# Patient Record
Sex: Male | Born: 1935 | Race: White | Hispanic: No | Marital: Married | Smoking: Never smoker
Health system: Southern US, Community
[De-identification: ages and names within clinical notes are randomized; demographics above are authoritative.]

## PROBLEM LIST (undated history)

## (undated) DIAGNOSIS — I080 Rheumatic disorders of both mitral and aortic valves: Secondary | ICD-10-CM

## (undated) DIAGNOSIS — F0280 Dementia in other diseases classified elsewhere without behavioral disturbance: Secondary | ICD-10-CM

## (undated) DIAGNOSIS — G309 Alzheimer's disease, unspecified: Principal | ICD-10-CM

## (undated) DIAGNOSIS — I444 Left anterior fascicular block: Secondary | ICD-10-CM

## (undated) DIAGNOSIS — I2510 Atherosclerotic heart disease of native coronary artery without angina pectoris: Secondary | ICD-10-CM

## (undated) DIAGNOSIS — I4821 Permanent atrial fibrillation: Secondary | ICD-10-CM

## (undated) DIAGNOSIS — E785 Hyperlipidemia, unspecified: Secondary | ICD-10-CM

## (undated) HISTORY — DX: Dementia in other diseases classified elsewhere, unspecified severity, without behavioral disturbance, psychotic disturbance, mood disturbance, and anxiety: F02.80

## (undated) HISTORY — PX: CHOLECYSTECTOMY: SHX55

## (undated) HISTORY — DX: Atherosclerotic heart disease of native coronary artery without angina pectoris: I25.10

## (undated) HISTORY — DX: Alzheimer's disease, unspecified: G30.9

## (undated) HISTORY — DX: Permanent atrial fibrillation: I48.21

## (undated) HISTORY — DX: Hyperlipidemia, unspecified: E78.5

## (undated) HISTORY — PX: HERNIA REPAIR: SHX51

## (undated) HISTORY — DX: Left anterior fascicular block: I44.4

## (undated) HISTORY — DX: Rheumatic disorders of both mitral and aortic valves: I08.0

---

## 2003-02-02 ENCOUNTER — Inpatient Hospital Stay (HOSPITAL_COMMUNITY): Admission: AD | Admit: 2003-02-02 | Discharge: 2003-02-04 | Payer: Self-pay | Admitting: Pulmonary Disease

## 2003-09-07 ENCOUNTER — Inpatient Hospital Stay (HOSPITAL_COMMUNITY): Admission: EM | Admit: 2003-09-07 | Discharge: 2003-09-10 | Payer: Self-pay | Admitting: Cardiology

## 2004-11-17 ENCOUNTER — Ambulatory Visit: Payer: Self-pay | Admitting: Cardiology

## 2004-11-26 ENCOUNTER — Ambulatory Visit: Payer: Self-pay | Admitting: Cardiology

## 2004-12-10 ENCOUNTER — Ambulatory Visit: Payer: Self-pay | Admitting: Cardiology

## 2004-12-30 ENCOUNTER — Ambulatory Visit: Payer: Self-pay | Admitting: Cardiology

## 2005-01-14 ENCOUNTER — Ambulatory Visit: Payer: Self-pay | Admitting: Cardiology

## 2005-01-22 ENCOUNTER — Ambulatory Visit: Payer: Self-pay

## 2005-02-05 ENCOUNTER — Ambulatory Visit: Payer: Self-pay | Admitting: Cardiology

## 2005-03-05 ENCOUNTER — Ambulatory Visit: Payer: Self-pay | Admitting: Cardiology

## 2005-04-02 ENCOUNTER — Ambulatory Visit: Payer: Self-pay | Admitting: Cardiology

## 2005-04-30 ENCOUNTER — Ambulatory Visit: Payer: Self-pay | Admitting: Cardiology

## 2005-06-01 ENCOUNTER — Ambulatory Visit: Payer: Self-pay | Admitting: Internal Medicine

## 2005-06-08 ENCOUNTER — Ambulatory Visit: Payer: Self-pay | Admitting: Cardiology

## 2005-06-22 ENCOUNTER — Ambulatory Visit: Payer: Self-pay | Admitting: Cardiology

## 2005-06-29 ENCOUNTER — Ambulatory Visit: Payer: Self-pay | Admitting: Cardiology

## 2005-07-13 ENCOUNTER — Ambulatory Visit: Payer: Self-pay | Admitting: Cardiology

## 2005-08-17 ENCOUNTER — Ambulatory Visit: Payer: Self-pay | Admitting: Cardiology

## 2005-08-25 ENCOUNTER — Ambulatory Visit: Payer: Self-pay | Admitting: Cardiology

## 2005-09-03 ENCOUNTER — Ambulatory Visit: Payer: Self-pay | Admitting: Cardiology

## 2005-10-01 ENCOUNTER — Ambulatory Visit: Payer: Self-pay | Admitting: Cardiology

## 2005-10-29 ENCOUNTER — Ambulatory Visit: Payer: Self-pay | Admitting: Cardiology

## 2005-11-27 ENCOUNTER — Ambulatory Visit: Payer: Self-pay | Admitting: Cardiology

## 2005-12-03 ENCOUNTER — Ambulatory Visit: Payer: Self-pay | Admitting: Cardiology

## 2005-12-16 ENCOUNTER — Ambulatory Visit: Payer: Self-pay | Admitting: Cardiology

## 2005-12-18 ENCOUNTER — Ambulatory Visit: Payer: Self-pay | Admitting: Cardiology

## 2006-01-05 ENCOUNTER — Ambulatory Visit: Payer: Self-pay | Admitting: Internal Medicine

## 2006-01-14 ENCOUNTER — Ambulatory Visit: Payer: Self-pay | Admitting: Cardiology

## 2006-02-18 ENCOUNTER — Ambulatory Visit: Payer: Self-pay | Admitting: Cardiology

## 2006-03-18 ENCOUNTER — Ambulatory Visit: Payer: Self-pay | Admitting: Cardiology

## 2006-04-15 ENCOUNTER — Ambulatory Visit: Payer: Self-pay | Admitting: Cardiology

## 2006-05-18 ENCOUNTER — Ambulatory Visit: Payer: Self-pay | Admitting: Cardiology

## 2006-05-31 ENCOUNTER — Ambulatory Visit: Payer: Self-pay | Admitting: Cardiology

## 2006-06-30 ENCOUNTER — Ambulatory Visit: Payer: Self-pay | Admitting: Cardiology

## 2006-07-15 ENCOUNTER — Ambulatory Visit: Payer: Self-pay | Admitting: Cardiology

## 2007-11-30 ENCOUNTER — Encounter: Payer: Self-pay | Admitting: Cardiology

## 2007-11-30 ENCOUNTER — Ambulatory Visit: Payer: Self-pay | Admitting: Cardiology

## 2007-12-06 ENCOUNTER — Ambulatory Visit: Payer: Self-pay | Admitting: Cardiovascular Disease

## 2007-12-06 ENCOUNTER — Inpatient Hospital Stay (HOSPITAL_BASED_OUTPATIENT_CLINIC_OR_DEPARTMENT_OTHER): Admission: RE | Admit: 2007-12-06 | Discharge: 2007-12-06 | Payer: Self-pay | Admitting: Cardiovascular Disease

## 2007-12-06 ENCOUNTER — Inpatient Hospital Stay (HOSPITAL_COMMUNITY): Admission: AD | Admit: 2007-12-06 | Discharge: 2007-12-09 | Payer: Self-pay | Admitting: Cardiovascular Disease

## 2007-12-12 ENCOUNTER — Ambulatory Visit: Payer: Self-pay | Admitting: Cardiology

## 2007-12-13 ENCOUNTER — Ambulatory Visit: Payer: Self-pay | Admitting: Cardiology

## 2007-12-20 ENCOUNTER — Ambulatory Visit: Payer: Self-pay | Admitting: Cardiology

## 2007-12-27 ENCOUNTER — Ambulatory Visit: Payer: Self-pay | Admitting: Cardiology

## 2007-12-27 ENCOUNTER — Encounter: Payer: Self-pay | Admitting: Physician Assistant

## 2008-01-12 ENCOUNTER — Ambulatory Visit: Payer: Self-pay | Admitting: Cardiology

## 2008-01-20 ENCOUNTER — Ambulatory Visit: Payer: Self-pay | Admitting: Cardiology

## 2008-04-17 ENCOUNTER — Ambulatory Visit: Payer: Self-pay | Admitting: Cardiology

## 2008-07-11 ENCOUNTER — Ambulatory Visit: Payer: Self-pay | Admitting: Cardiology

## 2008-07-11 ENCOUNTER — Encounter: Payer: Self-pay | Admitting: Cardiology

## 2008-07-25 ENCOUNTER — Ambulatory Visit: Payer: Self-pay | Admitting: Cardiology

## 2008-07-25 ENCOUNTER — Encounter: Payer: Self-pay | Admitting: Physician Assistant

## 2008-07-31 ENCOUNTER — Ambulatory Visit: Payer: Self-pay | Admitting: Cardiology

## 2008-12-05 ENCOUNTER — Ambulatory Visit: Payer: Self-pay | Admitting: Cardiovascular Disease

## 2008-12-05 ENCOUNTER — Ambulatory Visit: Payer: Self-pay | Admitting: Cardiology

## 2008-12-05 DIAGNOSIS — I4891 Unspecified atrial fibrillation: Secondary | ICD-10-CM

## 2008-12-05 DIAGNOSIS — I2510 Atherosclerotic heart disease of native coronary artery without angina pectoris: Secondary | ICD-10-CM

## 2008-12-05 DIAGNOSIS — I080 Rheumatic disorders of both mitral and aortic valves: Secondary | ICD-10-CM | POA: Insufficient documentation

## 2009-02-06 ENCOUNTER — Ambulatory Visit: Payer: Self-pay | Admitting: Cardiology

## 2009-03-25 ENCOUNTER — Ambulatory Visit: Payer: Self-pay | Admitting: Cardiology

## 2009-03-26 ENCOUNTER — Encounter: Payer: Self-pay | Admitting: Cardiology

## 2009-08-05 ENCOUNTER — Ambulatory Visit: Payer: Self-pay | Admitting: Cardiology

## 2009-11-22 ENCOUNTER — Encounter (INDEPENDENT_AMBULATORY_CARE_PROVIDER_SITE_OTHER): Payer: Self-pay | Admitting: *Deleted

## 2009-12-04 ENCOUNTER — Ambulatory Visit: Payer: Self-pay | Admitting: Cardiology

## 2009-12-31 ENCOUNTER — Encounter: Payer: Self-pay | Admitting: Cardiology

## 2010-02-17 ENCOUNTER — Ambulatory Visit: Payer: Self-pay | Admitting: Cardiology

## 2010-02-17 DIAGNOSIS — I4460 Unspecified fascicular block: Secondary | ICD-10-CM

## 2010-02-24 ENCOUNTER — Encounter: Payer: Self-pay | Admitting: Cardiology

## 2010-02-27 ENCOUNTER — Encounter: Payer: Self-pay | Admitting: Cardiology

## 2010-02-27 ENCOUNTER — Ambulatory Visit: Payer: Self-pay | Admitting: Cardiology

## 2010-02-28 ENCOUNTER — Encounter: Payer: Self-pay | Admitting: Cardiology

## 2010-03-07 ENCOUNTER — Ambulatory Visit: Payer: Self-pay | Admitting: Cardiology

## 2010-03-14 ENCOUNTER — Ambulatory Visit: Payer: Self-pay | Admitting: Cardiology

## 2010-03-14 LAB — CONVERTED CEMR LAB: POC INR: 2.3

## 2010-03-21 ENCOUNTER — Ambulatory Visit: Payer: Self-pay | Admitting: Cardiology

## 2010-03-28 ENCOUNTER — Ambulatory Visit: Payer: Self-pay | Admitting: Cardiology

## 2010-04-04 ENCOUNTER — Ambulatory Visit: Payer: Self-pay | Admitting: Cardiology

## 2010-04-04 LAB — CONVERTED CEMR LAB: POC INR: 4

## 2010-04-07 ENCOUNTER — Encounter: Payer: Self-pay | Admitting: Cardiology

## 2010-04-08 ENCOUNTER — Ambulatory Visit: Payer: Self-pay | Admitting: Cardiology

## 2010-04-18 ENCOUNTER — Ambulatory Visit: Payer: Self-pay | Admitting: Cardiology

## 2010-04-18 LAB — CONVERTED CEMR LAB: POC INR: 2.9

## 2010-04-22 ENCOUNTER — Telehealth (INDEPENDENT_AMBULATORY_CARE_PROVIDER_SITE_OTHER): Payer: Self-pay | Admitting: *Deleted

## 2010-04-25 ENCOUNTER — Ambulatory Visit: Payer: Self-pay | Admitting: Cardiology

## 2010-04-25 LAB — CONVERTED CEMR LAB: POC INR: 3

## 2010-05-23 ENCOUNTER — Ambulatory Visit: Payer: Self-pay | Admitting: Cardiology

## 2010-06-13 ENCOUNTER — Ambulatory Visit: Payer: Self-pay | Admitting: Cardiology

## 2010-06-13 LAB — CONVERTED CEMR LAB: POC INR: 2.4

## 2010-07-11 ENCOUNTER — Ambulatory Visit: Payer: Self-pay | Admitting: Cardiology

## 2010-08-08 ENCOUNTER — Ambulatory Visit: Payer: Self-pay | Admitting: Cardiology

## 2010-08-12 ENCOUNTER — Ambulatory Visit: Payer: Self-pay | Admitting: Cardiology

## 2010-09-05 ENCOUNTER — Ambulatory Visit: Payer: Self-pay | Admitting: Cardiology

## 2010-10-03 ENCOUNTER — Ambulatory Visit: Payer: Self-pay | Admitting: Cardiology

## 2010-10-03 LAB — CONVERTED CEMR LAB: POC INR: 1.2

## 2010-10-13 ENCOUNTER — Ambulatory Visit: Payer: Self-pay | Admitting: Cardiology

## 2010-10-17 ENCOUNTER — Ambulatory Visit: Payer: Self-pay | Admitting: Cardiology

## 2010-10-17 LAB — CONVERTED CEMR LAB: POC INR: 2.7

## 2010-11-21 ENCOUNTER — Ambulatory Visit: Payer: Self-pay | Admitting: Cardiology

## 2010-12-01 ENCOUNTER — Ambulatory Visit: Payer: Self-pay | Admitting: Cardiology

## 2010-12-10 ENCOUNTER — Telehealth (INDEPENDENT_AMBULATORY_CARE_PROVIDER_SITE_OTHER): Payer: Self-pay | Admitting: *Deleted

## 2010-12-19 ENCOUNTER — Ambulatory Visit: Payer: Self-pay | Admitting: Cardiology

## 2010-12-19 LAB — CONVERTED CEMR LAB: POC INR: 2.6

## 2011-01-20 NOTE — Medication Information (Signed)
Summary: ccr-lr  Anticoagulant Therapy  Managed by: Vashti Hey, RN PCP: Dr. Ernestine Conrad Supervising MD: Andee Lineman MD, Michelle Piper Indication 1: Atrial Fibrillation Lab Used: [facility] [address] Site: [place] INR POC 2.4  Dietary changes: no    Health status changes: no    Bleeding/hemorrhagic complications: no    Recent/future hospitalizations: no    Any changes in medication regimen? no    Recent/future dental: no  Any missed doses?: no       Is patient compliant with meds? yes       Allergies: No Known Drug Allergies  Anticoagulation Management History:      The patient is taking warfarin and comes in today for a routine follow up visit.  Positive risk factors for bleeding include an age of 75 years or older.  The bleeding index is 'intermediate risk'.  Positive CHADS2 values include Age > 75 years old.  His last INR was 3.1.  Anticoagulation responsible provider: Andee Lineman MD, Michelle Piper.  INR POC: 2.4.  Cuvette Lot#: [id number].  Exp: 07/2011.    Anticoagulation Management Assessment/Plan:      The patient's current anticoagulation dose is Coumadin 5 mg tabs: take as directed per PMD.  The target INR is 2.0-3.0.  The next INR is due 09/05/2010.  Anticoagulation instructions were given to patient.  Results were reviewed/authorized by Vashti Hey, RN.  He was notified by Vashti Hey RN.         Prior Anticoagulation Instructions: INR 3.2 Hold coumadin tonight then resume 2.5mg  once daily except 5mg  on Wednesdays  Current Anticoagulation Instructions: INR 2.4 Continue coumadin 2.5mg  once daily except 5mg  on Wednesdays

## 2011-01-20 NOTE — Medication Information (Signed)
Summary: ccr-lr  Anticoagulant Therapy  Managed by: Vashti Hey, RN PCP: Dr. Ernestine Conrad Supervising MD: Andee Lineman MD, Michelle Piper Indication 1: Atrial Fibrillation Lab Used: [facility] [place] Site: [place] INR POC 1.2  Dietary changes: no    Health status changes: no    Bleeding/hemorrhagic complications: no    Recent/future hospitalizations: no    Any changes in medication regimen? no    Recent/future dental: no  Any missed doses?: no       Is patient compliant with meds? yes       Allergies: No Known Drug Allergies  Anticoagulation Management History:      The patient is taking warfarin and comes in today for a routine follow up visit.  Positive risk factors for bleeding include an age of 75 years or older.  The bleeding index is 'intermediate risk'.  Positive CHADS2 values include Age > 40 years old.  His last INR was 3.1.  Anticoagulation responsible provider: Andee Lineman MD, Michelle Piper.  INR POC: 1.2.  Cuvette Lot#: [id number].  Exp: 07/2011.    Anticoagulation Management Assessment/Plan:      The patient's current anticoagulation dose is Coumadin 5 mg tabs: take as directed per PMD.  The target INR is 2.0-3.0.  The next INR is due 10/17/2010.  Anticoagulation instructions were given to patient.  Results were reviewed/authorized by Vashti Hey, RN.  He was notified by Vashti Hey RN.         Prior Anticoagulation Instructions: INR 3.2 Hold coumadin tonight then resueme 2.5mg  once daily except 5mg  on Wednesdays  Current Anticoagulation Instructions: INR 1.2 Take coumadin 5mg  tonight and tomorrow night then resume 2.5mg  once daily except 5mg  on Wednesdays

## 2011-01-20 NOTE — Medication Information (Signed)
Summary: ccr-lr  Anticoagulant Therapy  Managed by: Vashti Hey, RN PCP: Dr. Ernestine Conrad Supervising MD: Diona Browner MD, Remi Deter Indication 1: Atrial Fibrillation Lab Used: [facility] [place] Site: [place] INR POC 2.9  Dietary changes: no    Health status changes: no    Bleeding/hemorrhagic complications: no    Recent/future hospitalizations: yes       Details: pending TEE cardioversion  Any changes in medication regimen? no    Recent/future dental: no  Any missed doses?: no       Is patient compliant with meds? yes       Allergies: No Known Drug Allergies  Anticoagulation Management History:      The patient is taking warfarin and comes in today for a routine follow up visit.  Positive risk factors for bleeding include an age of 75 years or older.  The bleeding index is 'intermediate risk'.  Negative CHADS2 values include Age > 13 years old.  His last INR was 3.1.  Anticoagulation responsible provider: Diona Browner MD, Remi Deter.  INR POC: 2.9.  Cuvette Lot#: [id number].    Anticoagulation Management Assessment/Plan:      The patient's current anticoagulation dose is Coumadin 5 mg tabs: take as directed per PMD.  The target INR is 2.0-3.0.  The next INR is due 04/25/2010.  Anticoagulation instructions were given to patient.  Results were reviewed/authorized by Vashti Hey, RN.  He was notified by Vashti Hey RN.         Prior Anticoagulation Instructions: INR 4.0 Hold coumadin tonight then take 1/2 tablet once daily except 1 tablet on Wednesdays  Current Anticoagulation Instructions: INR 2.9 Continue coumadin 2.5mg  once daily except 5mg  on Wednesdays

## 2011-01-20 NOTE — Progress Notes (Signed)
Summary: decline DCCV   Phone Note Other Incoming   Summary of Call: Spoke with patient at last coumadin check regarding pending DCCV.  Pt. states he really is not sure about doing this procedure.  States that if Dr. Andee Lineman feels like he definitely needs this done, he would.  But, if he is okay to continue along as is with comadin and routine follow up with MD he would prefer that.   Hoover Brunette, LPN  Apr 22, 1609 11:07 AM    Follow-up for Phone Call        Discussed with Dr. Earnestine Leys who states pt is okay to postpone DCCV, if he chooses. Pt can schedule OV to d/w Dr. Earnestine Leys if desired. Per Dr. Earnestine Leys, pt is okay to no longer have weekly CCR visits since he wants to hold off on DCCV at present. Follow-up by: Cyril Loosen, RN, BSN,  Apr 25, 2010 9:11 AM     Appended Document: decline DCCV  Pt has reminder in IDX for Sept f/u with Dr. Earnestine Leys. Pt offered appt sooner to discuss DCCV. Pt declines and states he will see Dr. Earnestine Leys in Sept as planned.

## 2011-01-20 NOTE — Miscellaneous (Signed)
Summary: Orders Update - dccv  Clinical Lists Changes  Orders: Added new Referral order of Cardioversion (Cardioversion) - Signed 

## 2011-01-20 NOTE — Medication Information (Signed)
Summary: ccr-lr  Anticoagulant Therapy  Managed by: Vashti Hey, RN PCP: Dr. Ernestine Conrad Supervising MD: Diona Browner MD, Remi Deter Indication 1: Atrial Fibrillation Lab Used: [facility] [place] Site: [place] INR POC 3.3  Dietary changes: no    Health status changes: no    Bleeding/hemorrhagic complications: no    Recent/future hospitalizations: no    Any changes in medication regimen? no    Recent/future dental: no  Any missed doses?: no       Is patient compliant with meds? yes       Allergies: No Known Drug Allergies  Anticoagulation Management History:      The patient is taking warfarin and comes in today for a routine follow up visit.  Positive risk factors for bleeding include an age of 75 years or older.  The bleeding index is 'intermediate risk'.  Negative CHADS2 values include Age > 75 years old.  His last INR was 3.1.  Anticoagulation responsible provider: Diona Browner MD, Remi Deter.  INR POC: 3.3.  Cuvette Lot#: [id number].    Anticoagulation Management Assessment/Plan:      The patient's current anticoagulation dose is Coumadin 5 mg tabs: take as directed per PMD.  The target INR is 2.0-3.0.  The next INR is due 03/28/2010.  Anticoagulation instructions were given to patient.  Results were reviewed/authorized by Vashti Hey, RN.  He was notified by Vashti Hey RN.         Prior Anticoagulation Instructions: INR 2.3 Increase coumadin 2.5mg  once daily except 5mg  on M,W,F Pending DCCV  Current Anticoagulation Instructions: INR 3.3 Take coumadin 2.5mg  once daily except 5mg  on Mondays and Wednesdays

## 2011-01-20 NOTE — Medication Information (Signed)
Summary: ccr  Anticoagulant Therapy  Managed by: Vashti Hey, RN PCP: Dr. Ernestine Conrad Supervising MD: Andee Lineman MD, Michelle Piper Indication 1: Atrial Fibrillation Lab Used: [facility] [place] Site: [place] INR POC 2.3  Dietary changes: no    Health status changes: no    Bleeding/hemorrhagic complications: no    Recent/future hospitalizations: yes       Details: pending DCCV  Any changes in medication regimen? no    Recent/future dental: no  Any missed doses?: no       Is patient compliant with meds? yes       Allergies: No Known Drug Allergies  Anticoagulation Management History:      The patient is taking warfarin and comes in today for a routine follow up visit.  Positive risk factors for bleeding include an age of 32 years or older.  The bleeding index is 'intermediate risk'.  Negative CHADS2 values include Age > 54 years old.  His last INR was 3.1.  Anticoagulation responsible provider: Andee Lineman MD, Michelle Piper.  INR POC: 2.3.  Cuvette Lot#: [id number].    Anticoagulation Management Assessment/Plan:      The patient's current anticoagulation dose is Coumadin 5 mg tabs: take as directed per PMD.  The target INR is 2.0-3.0.  The next INR is due 03/21/2010.  Anticoagulation instructions were given to patient.  Results were reviewed/authorized by Vashti Hey, RN.  He was notified by Vashti Hey RN.         Prior Anticoagulation Instructions: INR 2.8 Continue coumadin 2.5mg  once daily except 5mg  on Mondays and Fridays  Current Anticoagulation Instructions: INR 2.3 Increase coumadin 2.5mg  once daily except 5mg  on M,W,F Pending DCCV

## 2011-01-20 NOTE — Medication Information (Signed)
Summary: ccr-lr  Anticoagulant Therapy  Managed by: Vashti Hey, RN PCP: Dr. Ernestine Conrad Supervising MD: Andee Lineman MD, Michelle Piper Indication 1: Atrial Fibrillation Lab Used: [facility] [place] Site: [place] INR POC 4.0  Dietary changes: no    Health status changes: no    Bleeding/hemorrhagic complications: no    Recent/future hospitalizations: yes       Details: Pending DCCV  Any changes in medication regimen? no    Recent/future dental: no  Any missed doses?: no       Is patient compliant with meds? yes       Allergies: No Known Drug Allergies  Anticoagulation Management History:      The patient is taking warfarin and comes in today for a routine follow up visit.  Positive risk factors for bleeding include an age of 75 years or older.  The bleeding index is 'intermediate risk'.  Negative CHADS2 values include Age > 64 years old.  His last INR was 3.1.  Anticoagulation responsible provider: Andee Lineman MD, Michelle Piper.  INR POC: 4.0.  Cuvette Lot#: [id number].    Anticoagulation Management Assessment/Plan:      The patient's current anticoagulation dose is Coumadin 5 mg tabs: take as directed per PMD.  The target INR is 2.0-3.0.  The next INR is due 04/18/2010.  Anticoagulation instructions were given to patient.  Results were reviewed/authorized by Vashti Hey, RN.  He was notified by Vashti Hey RN.         Prior Anticoagulation Instructions: INR 3.7 Hold coumadin tonight then resume coumadin 2.5mg  once daily except 5mg  on Mondays and Wednesdays  Current Anticoagulation Instructions: INR 4.0 Hold coumadin tonight then take 1/2 tablet once daily except 1 tablet on Wednesdays

## 2011-01-20 NOTE — Medication Information (Signed)
Summary: ccr-lr  Anticoagulant Therapy  Managed by: Vashti Hey, RN PCP: Dr. Ernestine Conrad Supervising MD: Myrtis Ser MD, Tinnie Gens Indication 1: Atrial Fibrillation Lab Used: [facility] [place] Site: [place] INR POC 2.7  Dietary changes: no    Health status changes: no    Bleeding/hemorrhagic complications: no    Recent/future hospitalizations: no    Any changes in medication regimen? no    Recent/future dental: no  Any missed doses?: no       Is patient compliant with meds? yes       Allergies: No Known Drug Allergies  Anticoagulation Management History:      The patient is taking warfarin and comes in today for a routine follow up visit.  Positive risk factors for bleeding include an age of 21 years or older.  The bleeding index is 'intermediate risk'.  Positive CHADS2 values include Age > 13 years old.  His last INR was 3.1.  Anticoagulation responsible provider: Myrtis Ser MD, Tinnie Gens.  INR POC: 2.7.  Cuvette Lot#: [id number].  Exp: 07/2011.    Anticoagulation Management Assessment/Plan:      The patient's current anticoagulation dose is Coumadin 5 mg tabs: take as directed per PMD.  The target INR is 2.0-3.0.  The next INR is due 11/11/2010.  Anticoagulation instructions were given to patient.  Results were reviewed/authorized by Vashti Hey, RN.  He was notified by Vashti Hey RN.         Prior Anticoagulation Instructions: INR 1.2 Take coumadin 5mg  tonight and tomorrow night then resume 2.5mg  once daily except 5mg  on Wednesdays  Current Anticoagulation Instructions: INR 2.7 Continue coumadin 2.5mg  once daily except 5mg  on Wednesdays

## 2011-01-20 NOTE — Procedures (Signed)
Summary: Holter and Event/ [facility]  Holter and Event/ [facility]   Imported By: Dorise Hiss 02/27/2010 09:23:48  _____________________________________________________________________  External Attachment:    Type:   Image     Comment:   External Document  Appended Document: Holter and Event/ [facility] patient is in permanent afib. schedule for DCCV next week . has been long term on coumadin. make sure recent INR's ok.   Appended Document: Holter and Event/ [facility] Patient notified.   As discussed previously, pt. has not been checked since January.  Former PMD was [facility], but swithched to [employer] due to insurance change.  Checked with both offices and looks like he has not had checked since January.  Will wait till therapeutic x 4 weeks, then proceed with scheduling DCCV.

## 2011-01-20 NOTE — Medication Information (Signed)
Summary: rov/sp  Anticoagulant Therapy  Managed by: Vashti Hey, RN PCP: Dr. Ernestine Conrad Supervising MD: Diona Browner MD, Remi Deter Indication 1: Atrial Fibrillation Lab Used: [facility] [place] Site: [place] INR POC 3.2  Dietary changes: no    Health status changes: no    Bleeding/hemorrhagic complications: no    Recent/future hospitalizations: no    Any changes in medication regimen? no    Recent/future dental: no  Any missed doses?: no       Is patient compliant with meds? yes       Allergies: No Known Drug Allergies  Anticoagulation Management History:      The patient is taking warfarin and comes in today for a routine follow up visit.  Positive risk factors for bleeding include an age of 75 years or older.  The bleeding index is 'intermediate risk'.  Positive CHADS2 values include Age > 75 years old.  His last INR was 3.1.  Anticoagulation responsible provider: Diona Browner MD, Remi Deter.  INR POC: 3.2.  Cuvette Lot#: [id number].  Exp: 07/2011.    Anticoagulation Management Assessment/Plan:      The patient's current anticoagulation dose is Coumadin 5 mg tabs: take as directed per PMD.  The target INR is 2.0-3.0.  The next INR is due 08/08/2010.  Anticoagulation instructions were given to patient.  Results were reviewed/authorized by Vashti Hey, RN.  He was notified by Vashti Hey RN.         Prior Anticoagulation Instructions: INR 2.4  Continue same dose of 1/2 tablet every day except 1 tablet on Wednesday.    Current Anticoagulation Instructions: INR 3.2 Hold coumadin tonight then resume 2.5mg  once daily except 5mg  on Wednesdays

## 2011-01-20 NOTE — Letter (Signed)
Summary: Appointment -missed  [facility] [address]   [place], [place] [address]   Phone: [phone]  Fax: [phone]     March 28, 2010 MRN: [id number]     Ian [name] [address] [place], [place]  [address]     Dear Mr. Ian [name],  Our records indicate you missed your appointment on March 28, 2010                        with [facility].   It is very important that we reach you to reschedule this appointment. We look forward to participating in your health care needs.   Please contact us at the number listed above at your earliest convenience to reschedule this appointment.   Sincerely,    Glass blower/designer

## 2011-01-20 NOTE — Medication Information (Signed)
Summary: ccr-lr  Anticoagulant Therapy  Managed by: Vashti Hey, RN PCP: Dr. Ernestine Conrad Supervising MD: Diona Browner MD, Remi Deter Indication 1: Atrial Fibrillation Lab Used: [facility] [place] Site: [place] INR POC 3.2  Dietary changes: no    Health status changes: no    Bleeding/hemorrhagic complications: no    Recent/future hospitalizations: no    Any changes in medication regimen? no    Recent/future dental: no  Any missed doses?: no       Is patient compliant with meds? yes       Allergies: No Known Drug Allergies  Anticoagulation Management History:      The patient is taking warfarin and comes in today for a routine follow up visit.  Positive risk factors for bleeding include an age of 21 years or older.  The bleeding index is 'intermediate risk'.  Positive CHADS2 values include Age > 75 years old.  His last INR was 3.1.  Anticoagulation responsible provider: Diona Browner MD, Remi Deter.  INR POC: 3.2.  Cuvette Lot#: [id number].  Exp: 07/2011.    Anticoagulation Management Assessment/Plan:      The patient's current anticoagulation dose is Coumadin 5 mg tabs: take as directed per PMD.  The target INR is 2.0-3.0.  The next INR is due 12/19/2010.  Anticoagulation instructions were given to patient.  Results were reviewed/authorized by Vashti Hey, RN.  He was notified by Vashti Hey RN.         Prior Anticoagulation Instructions: INR 2.7 Continue coumadin 2.5mg  once daily except 5mg  on Wednesdays  Current Anticoagulation Instructions: INR 3.2 Hold coumadin toniight then resume 2.5mg  once daily except 5mg  on Wednesdays

## 2011-01-20 NOTE — Assessment & Plan Note (Signed)
Summary: 6 month fu-rec v reminder vs   Visit Type:  Follow-up Primary Provider:  Dr. Ernestine Conrad   History of Present Illness: the patient's is a 77 year old Ian [name] with a history of coronary artery disease, treated medically, chronic left anterior fascicular block and chronic atrial fibrillation. Initially we had plans to cardiovert the patient earlier this year but ultimately the patient decided against it. His rate has remained controlled and is now in chronic A. fib . He denies any chest pain. He has no orthopnea PND palpitations or syncope. He actually works full-time and experiences no decrease in his exercise tolerance. His last cardiac catheterization was done in 2008.  Preventive Screening-Counseling & Management  Alcohol-Tobacco     Smoking Status: never  Current Medications (verified): 1)  Coumadin 5 Mg Tabs (Warfarin Sodium) .... Take As Directed Per Pmd 2)  Nitrostat 0.4 Mg Subl (Nitroglycerin) .... Place 1 Tablet Under Tongue As Directed 3)  Aspirin 81 Mg Tbec (Aspirin) .... Take 1 Tablet By Mouth Once A Day 4)  Pindolol 5 Mg Tabs (Pindolol) .... Take 1 Tablet By Mouth Once A Day 5)  Vitamin E 100 Unit Caps (Vitamin E) .... Take 1 Tablet By Mouth Once A Day 6)  3 Study Drugs (A, B, & C)  [place] Study .... Take 1 Tablet By Mouth Once A Day 7)  Multivitamins  Tabs (Multiple Vitamin) .... Take 1 Tablet By Mouth Once A Day  Allergies (verified): No Known Drug Allergies  Comments:  Nurse/Medical Assistant: The patient's medication list and allergies were reviewed with the patient and were updated in the Medication and Allergy Lists.  Past History:  Past Surgical History: Last updated: 12/05/2008 Cholecystectomy Bilateral hernia repair  Family History: Last updated: 12/05/2008 Father:? heart disease  Social History: Last updated: 12/05/2008 Married  Tobacco Use - No.   Risk Factors: Smoking Status: never (10/13/2010)  Past Medical History: Current Problems:    MITRAL REGURG W/ AORTIC INSUFF, RHEUM/NON-RHEUM (ICD-396.3) CAD, NATIVE VESSEL (ICD-414.01)Diffuse high grade LAD disease-medical Rx recommended 12/08 ATRIAL FIBRILLATION (ICD-427.31) Chronic atrial fibrillation. The patient ultimately declined cardioversion in April of 2011 Chronic Left Anterior Fasicular block Coumadin Rx IMPROVE-IT study Atrial Fibrillation CAD Hyperlipidemia  Review of Systems  The patient denies fatigue, malaise, fever, weight gain/loss, vision loss, decreased hearing, hoarseness, chest pain, palpitations, shortness of breath, prolonged cough, wheezing, sleep apnea, coughing up blood, abdominal pain, blood in stool, nausea, vomiting, diarrhea, heartburn, incontinence, blood in urine, muscle weakness, joint pain, leg swelling, rash, skin lesions, headache, fainting, dizziness, depression, anxiety, enlarged lymph nodes, easy bruising or bleeding, and environmental allergies.    Vital Signs:  Patient profile:   77 year old Ian [name] Height:      72 inches Weight:      194 pounds Pulse rate:   64 / minute BP sitting:   119 / 84  (left arm) Cuff size:   regular  Vitals Entered By: Carlye Grippe (October 13, 2010 9:45 AM)  Physical Exam  Additional Exam:  General: Well-developed, well-nourished in no distress head: Normocephalic and atraumatic eyes PERRLA/EOMI intact, conjunctiva and lids normal nose: No deformity or lesions mouth normal dentition, normal posterior pharynx neck: Supple, no JVD.  No masses, thyromegaly or abnormal cervical nodes lungs: Normal breath sounds bilaterally without wheezing.  Normal percussion heart: irregular rate and rhythm with normal S1 and S2, no S3 or S4.  PMI is normal.  No pathological murmurs abdomen: Normal bowel sounds, abdomen is soft and nontender without masses, organomegaly  or hernias noted.  No hepatosplenomegaly musculoskeletal: Back normal, normal gait muscle strength and tone normal pulsus: Pulse is normal in all 4  extremities Extremities: No peripheral pitting edema neurologic: Alert and oriented x 3 skin: Intact without lesions or rashes cervical nodes: No significant adenopathy psychologic: Normal affect    Impression & Recommendations:  Problem # 1:  COUMADIN THERAPY (ICD-V58.61) patient is compliant with Coumadin therapy and is followed in our Coumadin clinic.  Problem # 2:  HEMIBLOCK, LEFT ANTERIOR (ICD-426.2) Assessment: Comment Only  The following medications were removed from the medication list:    Acebutolol Hcl 200 Mg Caps (Acebutolol hcl) .Marland Kitchen... Take 1 tablet by mouth once a day His updated medication list for this problem includes:    Coumadin 5 Mg Tabs (Warfarin sodium) .Marland Kitchen... Take as directed per pmd    Nitrostat 0.4 Mg Subl (Nitroglycerin) .Marland Kitchen... Place 1 tablet under tongue as directed    Aspirin 81 Mg Tbec (Aspirin) .Marland Kitchen... Take 1 tablet by mouth once a day    Pindolol 5 Mg Tabs (Pindolol) .Marland Kitchen... Take 1 tablet by mouth once a day  Problem # 3:  ATRIAL FIBRILLATION, PAROXYSMAL (ICD-427.31) the patient is now in chronic atrial fibrillation. There are no further plans for cardioversion he is asymptomatic and his heart rate is controlled. The following medications were removed from the medication list:    Acebutolol Hcl 200 Mg Caps (Acebutolol hcl) .Marland Kitchen... Take 1 tablet by mouth once a day His updated medication list for this problem includes:    Coumadin 5 Mg Tabs (Warfarin sodium) .Marland Kitchen... Take as directed per pmd    Aspirin 81 Mg Tbec (Aspirin) .Marland Kitchen... Take 1 tablet by mouth once a day    Pindolol 5 Mg Tabs (Pindolol) .Marland Kitchen... Take 1 tablet by mouth once a day  Patient Instructions: 1)  Your physician wants you to follow-up in: 1 year. You will receive a reminder letter in the mail one-two months in advance. If you don't receive a letter, please call our office to schedule the follow-up appointment. 2)  Your physician recommends that you continue on your current medications as directed.  Please refer to the Current Medication list given to you today.

## 2011-01-20 NOTE — Assessment & Plan Note (Signed)
Summary: ccrv  --agh  Nurse Visit  Comments INR 3.1.  See coumadin document.     Allergies: No Known Drug Allergies Laboratory Results   Blood Tests      INR: 3.1   (Normal Range: 0.88-1.12   Therap INR: 2.0-3.5) Comments: Pt. will continue 2.5mg  qd except 5mg  on M,F Recheck 1 week, on 3/18. Pending DCCV

## 2011-01-20 NOTE — Medication Information (Signed)
Summary: ccr-lr  Anticoagulant Therapy  Managed by: Weston Brass, PharmD PCP: Dr. Ernestine Conrad Supervising MD: Andee Lineman MD, Michelle Piper Indication 1: Atrial Fibrillation Lab Used: [facility] [place] Site: [place] INR POC 2.4  Dietary changes: no    Health status changes: no    Bleeding/hemorrhagic complications: no    Recent/future hospitalizations: no    Any changes in medication regimen? no    Recent/future dental: no  Any missed doses?: no       Is patient compliant with meds? yes       Allergies: No Known Drug Allergies  Anticoagulation Management History:      The patient is taking warfarin and comes in today for a routine follow up visit.  Positive risk factors for bleeding include an age of 72 years or older.  The bleeding index is 'intermediate risk'.  Negative CHADS2 values include Age > 49 years old.  His last INR was 3.1.  Anticoagulation responsible provider: Andee Lineman MD, Michelle Piper.  INR POC: 2.4.  Cuvette Lot#: [id number].  Exp: 07/2011.    Anticoagulation Management Assessment/Plan:      The patient's current anticoagulation dose is Coumadin 5 mg tabs: take as directed per PMD.  The target INR is 2.0-3.0.  The next INR is due 07/11/2010.  Anticoagulation instructions were given to patient.  Results were reviewed/authorized by Weston Brass, PharmD.  He was notified by Weston Brass PharmD.         Prior Anticoagulation Instructions: INR 3.0 Continue coumadin 2.5mg  once daily except 5mg  on Wednesdays  Current Anticoagulation Instructions: INR 2.4  Continue same dose of 1/2 tablet every day except 1 tablet on Wednesday.

## 2011-01-20 NOTE — Medication Information (Signed)
Summary: ccr-lr  Anticoagulant Therapy  Managed by: Vashti Hey, RN PCP: Dr. Ernestine Conrad Supervising MD: Diona Browner MD, Remi Deter Indication 1: Atrial Fibrillation Lab Used: [facility] [place] Site: [place] INR POC 3.7  Dietary changes: no    Health status changes: no    Bleeding/hemorrhagic complications: no    Recent/future hospitalizations: no    Any changes in medication regimen? no    Recent/future dental: no  Any missed doses?: no       Is patient compliant with meds? yes       Allergies: No Known Drug Allergies  Anticoagulation Management History:      The patient is taking warfarin and comes in today for a routine follow up visit.  Positive risk factors for bleeding include an age of 33 years or older.  The bleeding index is 'intermediate risk'.  Negative CHADS2 values include Age > 31 years old.  His last INR was 3.1.  Anticoagulation responsible provider: Diona Browner MD, Remi Deter.  INR POC: 3.7.  Cuvette Lot#: [id number].    Anticoagulation Management Assessment/Plan:      The patient's current anticoagulation dose is Coumadin 5 mg tabs: take as directed per PMD.  The target INR is 2.0-3.0.  The next INR is due 04/04/2010.  Anticoagulation instructions were given to patient.  Results were reviewed/authorized by Vashti Hey, RN.  He was notified by Vashti Hey RN.         Prior Anticoagulation Instructions: INR 3.3 Take coumadin 2.5mg  once daily except 5mg  on Mondays and Wednesdays  Current Anticoagulation Instructions: INR 3.7 Hold coumadin tonight then resume coumadin 2.5mg  once daily except 5mg  on Mondays and Wednesdays

## 2011-01-20 NOTE — Medication Information (Signed)
Summary: CCR-LAST CHECK 04/25/10-JM  Anticoagulant Therapy  Managed by: Vashti Hey, RN PCP: Dr. Ernestine Conrad Supervising MD: Diona Browner MD, Remi Deter Indication 1: Atrial Fibrillation Lab Used: [facility] [place] Site: [place] INR POC 3.0  Dietary changes: no    Health status changes: no    Bleeding/hemorrhagic complications: no    Recent/future hospitalizations: no    Any changes in medication regimen? no    Recent/future dental: no  Any missed doses?: no       Is patient compliant with meds? yes       Allergies: No Known Drug Allergies  Anticoagulation Management History:      The patient is taking warfarin and comes in today for a routine follow up visit.  Positive risk factors for bleeding include an age of 75 years or older.  The bleeding index is 'intermediate risk'.  Negative CHADS2 values include Age > 61 years old.  His last INR was 3.1.  Anticoagulation responsible provider: Diona Browner MD, Remi Deter.  INR POC: 3.0.  Cuvette Lot#: [id number].    Anticoagulation Management Assessment/Plan:      The patient's current anticoagulation dose is Coumadin 5 mg tabs: take as directed per PMD.  The target INR is 2.0-3.0.  The next INR is due 06/13/2010.  Anticoagulation instructions were given to patient.  Results were reviewed/authorized by Vashti Hey, RN.  He was notified by Vashti Hey RN.         Prior Anticoagulation Instructions: INR 3.0 Continue coumadin 2.5mg  once daily except 5mg  on Wednesdays  Current Anticoagulation Instructions: Same as Prior Instructions.

## 2011-01-20 NOTE — Medication Information (Signed)
Summary: ccr-lr  Anticoagulant Therapy  Managed by: Cyril Loosen, RN PCP: Dr. Ernestine Conrad Supervising MD: Andee Lineman MD, Michelle Piper Indication 1: Atrial Fibrillation Lab Used: [facility] [place] Site: [place] INR POC 3.0  Dietary changes: no    Health status changes: no    Bleeding/hemorrhagic complications: no    Recent/future hospitalizations: yes       Details: Pt has declined DCCV  OK with Dr Andee Lineman to hold off for now  Any changes in medication regimen? no    Recent/future dental: no  Any missed doses?: no       Is patient compliant with meds? yes       Allergies: No Known Drug Allergies  Anticoagulation Management History:      The patient is taking warfarin and comes in today for a routine follow up visit.  Positive risk factors for bleeding include an age of 75 years or older.  The bleeding index is 'intermediate risk'.  Negative CHADS2 values include Age > 52 years old.  His last INR was 3.1.  Anticoagulation responsible provider: Andee Lineman MD, Michelle Piper.  INR POC: 3.0.  Cuvette Lot#: [id number].    Anticoagulation Management Assessment/Plan:      The patient's current anticoagulation dose is Coumadin 5 mg tabs: take as directed per PMD.  The target INR is 2.0-3.0.  The next INR is due 05/23/2010.  Anticoagulation instructions were given to patient.  Results were reviewed/authorized by Cyril Loosen, RN.  He was notified by Cyril Loosen, RN.        Coagulation management information includes: Pt has declined DCCV  OK with Dr Andee Lineman to hold off for now.  Does not need weekly checks any longer.  Prior Anticoagulation Instructions: INR 2.9 Continue coumadin 2.5mg  once daily except 5mg  on Wednesdays  Current Anticoagulation Instructions: INR 3.0 Continue coumadin 2.5mg  once daily except 5mg  on Wednesdays

## 2011-01-20 NOTE — Miscellaneous (Signed)
Summary: Orders Update - BMET,PT/INR  Clinical Lists Changes  Orders: Added new Test order of T-Basic Metabolic Panel ([id number]) - Signed Added new Test order of T-Protime, Auto ([id number]) - Signed 

## 2011-01-20 NOTE — Medication Information (Signed)
Summary: Coumadin Clinic  Anticoagulant Therapy  Managed by: Hoover Brunette, LPN PCP: Dr. Ernestine Conrad Supervising MD: Diona Browner MD, Remi Deter Indication 1: Atrial Fibrillation Lab Used: [facility] [place] Site: [place] INR POC 3.1  Dietary changes: no    Health status changes: no    Bleeding/hemorrhagic complications: no    Recent/future hospitalizations: yes       Details: pending DCCV  Any changes in medication regimen? no    Recent/future dental: no  Any missed doses?: no       Is patient compliant with meds? yes       Allergies: No Known Drug Allergies  Anticoagulation Management History:      The patient is taking warfarin and comes in today for a routine follow up visit.  Positive risk factors for bleeding include an age of 75 years or older.  The bleeding index is 'intermediate risk'.  Negative CHADS2 values include Age > 75 years old.  His last INR was 3.1.  Anticoagulation responsible provider: Diona Browner MD, Remi Deter.  INR POC: 3.1.  Cuvette Lot#: [id number].    Anticoagulation Management Assessment/Plan:      The patient's current anticoagulation dose is Coumadin 5 mg tabs: take as directed per PMD.  The target INR is 2.0-3.0.  The next INR is due 03/07/2010.  Anticoagulation instructions were given to patient.  Results were reviewed/authorized by Hoover Brunette, LPN.  He was notified by Hoover Brunette, LPN.        Coagulation management information includes: pending DCCV   Coumadin previously managed by PMD.  Current Anticoagulation Instructions: INR 3.1 Continue coumadin 2.5mg  once daily except 5mg  on Mondays and Fridays

## 2011-01-20 NOTE — Assessment & Plan Note (Signed)
Summary: 1 YR FU PER FEB REMINDER-SRS   Primary Provider:  Dr. Ernestine Conrad   History of Present Illness: the patient a 77 year old male with a history of single-vessel coronary artery diseaseand paroxysmal atrial fibrillation. Next  The patient reported approximately one month ago and irregular heartbeat. He felt that his pulse was skipping. He also noticed some tachycardia palpitations. They would typically last for several minutes. There was some associated weakness. The patient went to see his primary care physician no further action was taken. The patient denies any chest pain shortness of breath orthopnea PND. On exam today the patient appears to be in atrial fibrillation.  Clinical Review Panels:  Cardiac Imaging Cardiac Cath Findings     IMPRESSION:  The patient clearly has had anginal sounding chest pain   both with his stress test and at work.  I cannot explain why his Myoview   would have been read as inferolateral ischemia.  The OM branch and the   right do not have significant disease.  Despite the fact that the   patient may get angina from his diagonal and apical LAD   disease which is unchanged from 2004, I think there has been significant   progression in the mid to distal LAD.  This is his largest vessel and I   think should probably be stented.      The films will be reviewed with Dr. Riley Kill.  If so, we will proceed   with heparin and Plavix.               Noralyn Pick. 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 Take 1 Tablet By Mouth Once A Day 2)  Coumadin 5 Mg Tabs (Warfarin Sodium) .... Take As Directed Per Pmd 3)  Nitrostat 0.4 Mg Subl (Nitroglycerin) .... Place 1 Tablet Under Tongue As Directed 4)  Aspirin 81 Mg Tbec (Aspirin) .... Take 1 Tablet By Mouth Once A Day 5)  Pindolol 5 Mg Tabs (Pindolol) .... Take 1 Tablet By Mouth Once A Day 6)  Vitamin E 100 Unit Caps (Vitamin E) .... Take 1 Tablet By Mouth Once A Day 7)  3 Study Drugs (A, B, & C)  [place] Study .... Take 1 Tablet By Mouth Once A Day  Allergies (verified): No Known Drug Allergies  Past History:  Past Medical History: Last updated: 12/05/2008 Current Problems:  MITRAL REGURG W/ AORTIC INSUFF, RHEUM/NON-RHEUM (ICD-396.3) CAD, NATIVE VESSEL (ICD-414.01)Diffuse high grade LAD disease-medical Rx recommended 12/08 ATRIAL FIBRILLATION (ICD-427.31) Chronic Left Anterior Fasicular block Coumadin Rx IMPROVE-IT study Atrial Fibrillation CAD Hyperlipidemia  Past Surgical History: Last updated: 12/05/2008 Cholecystectomy Bilateral hernia repair  Family History: Last updated: 12/05/2008 Father:? heart disease  Social History: Last updated: 12/05/2008 Married  Tobacco Use - No.   Risk Factors: Smoking Status: never (02/17/2010)  Review of Systems       The patient complains of palpitations.  The patient denies fatigue, malaise, fever, weight gain/loss, vision loss, decreased hearing, hoarseness, chest pain, shortness of breath, prolonged cough, wheezing,  sleep apnea, coughing up blood, abdominal pain, blood in stool, nausea, vomiting, diarrhea, heartburn, incontinence, blood in urine, muscle weakness, joint pain, leg swelling, rash, skin lesions, headache, fainting, dizziness, depression, anxiety, enlarged lymph nodes, easy bruising or bleeding, and environmental allergies.    Vital Signs:  Patient profile:   77 year old male Height:      72 inches Weight:      192.50  pounds BMI:     26.20 Pulse rate:   76 / minute BP sitting:   109 / 73  (left arm) Cuff size:   regular  Vitals Entered By: Hoover Brunette, LPN (February 17, 2010 10:45 AM)  Nutrition Counseling: Patient's BMI is greater than 25 and therefore counseled on weight management options. Is Patient Diabetic? No Comments YEARLY CHECK   Physical Exam  Additional Exam:  General: Well-developed, well-nourished in no distress head: Normocephalic and atraumatic eyes PERRLA/EOMI intact, conjunctiva and lids normal nose: No deformity or lesions mouth normal dentition, normal posterior pharynx neck: Supple, no JVD.  No masses, thyromegaly or abnormal cervical nodes lungs: Normal breath sounds bilaterally without wheezing.  Normal percussion heart: irregular rate and rhythm with normal S1 and S2, no S3 or S4.  PMI is normal.  No pathological murmurs abdomen: Normal bowel sounds, abdomen is soft and nontender without masses, organomegaly or hernias noted.  No hepatosplenomegaly musculoskeletal: Back normal, normal gait muscle strength and tone normal pulsus: Pulse is normal in all 4 extremities Extremities: No peripheral pitting edema neurologic: Alert and oriented x 3 skin: Intact without lesions or rashes cervical nodes: No significant adenopathy psychologic: Normal affect    EKG  Procedure date:  02/17/2010  Findings:      atrial fibrillation with a heart rate of 74 beats per minute. Right bundle branch block and left anterior hemiblock.  Impression & Recommendations:  Problem # 1:  ATRIAL FIBRILLATION, PAROXYSMAL (ICD-427.31) the patient appears to be in permanent atrial fibrillation. He does report palpitations, some weakness and fatigue associated with this. Selective coronary gum shows no acute ischemic changes but does show a right bundle branch block and left anterior hemiblock and atrial fibrillation. We will proceed with a 48-hour Holter monitor. If the patient indeed is in  permanent atrial fibrillation then we will proceed with cardioversion next week. The patient has been anticoagulated for a long time and will review his recent INRs. His updated medication list for this problem includes:    Acebutolol Hcl 200 Mg Caps (Acebutolol hcl) .Marland Kitchen... Take 1 tablet by mouth once a day    Coumadin 5 Mg Tabs (Warfarin sodium) .Marland Kitchen... Take as directed per pmd    Aspirin 81 Mg Tbec (Aspirin) .Marland Kitchen... Take 1 tablet by mouth once a day    Pindolol 5 Mg Tabs (Pindolol) .Marland Kitchen... Take 1 tablet by mouth once a day  Problem # 2:  MITRAL REGURGITATION, 1 PLUS (ICD-396.3) no evidence of significant mitral regurgitation on exam. No clear indication to repeat echocardiogram at this point.  Problem # 3:  CAD, NATIVE VESSEL (ICD-414.01) the patient denies any angina. His last catheterization was in 2008. We will continue with current medical therapy. His updated medication list for this problem includes:    Acebutolol Hcl 200 Mg Caps (Acebutolol hcl) .Marland Kitchen... Take 1 tablet by mouth once a day    Coumadin 5 Mg Tabs (Warfarin sodium) .Marland Kitchen... Take as directed per pmd    Nitrostat 0.4 Mg Subl (Nitroglycerin) .Marland Kitchen... Place 1 tablet under tongue as directed  Aspirin 81 Mg Tbec (Aspirin) .Marland Kitchen... Take 1 tablet by mouth once a day    Pindolol 5 Mg Tabs (Pindolol) .Marland Kitchen... Take 1 tablet by mouth once a day  Problem # 4:  HEMIBLOCK, LEFT ANTERIOR (ICD-426.2) the patient has both a left anterior hemiblock and right bundle branch block. His updated medication list for this problem includes:    Acebutolol Hcl 200 Mg Caps (Acebutolol hcl) .Marland Kitchen... Take 1 tablet by mouth once a day    Coumadin 5 Mg Tabs (Warfarin sodium) .Marland Kitchen... Take as directed per pmd    Nitrostat 0.4 Mg Subl (Nitroglycerin) .Marland Kitchen... Place 1 tablet under tongue as directed    Aspirin 81 Mg Tbec (Aspirin) .Marland Kitchen... Take 1 tablet by mouth once a day    Pindolol 5 Mg Tabs (Pindolol) .Marland Kitchen... Take 1 tablet by mouth once a day  Problem # 5:  COUMADIN THERAPY  (ICD-V58.61)  Patient Instructions: 1)  48 hour holter monitor 2)  Cardioversion when therapeutic 3)  Follow up in  6 months.  Appended Document: Orders Update - holter monitor    Clinical Lists Changes  Orders: Added new Referral order of Holter Monitor (Holter Monitor) - Signed      Appended Document: 1 YR FU PER FEB REMINDER-SRS I reviewed 48 hour Holter monitor. The patient is now in permanent atrial fibrillation. His heart rate is poorly controlled at various times. Please review and make sure that he's been fully anticoagulated with therapeutic levels over the last 3-4 weeks. If so, we can proceed with cardioversion at the first available date. Please obtain labs and PT/INR on the day of cardioversion. I discussed cardioversion with the patient during her last office visit.  Appended Document: 1 YR FU PER FEB REMINDER-SRS Left message to return call.  Appended Document: 1 YR FU PER FEB REMINDER-SRS Patient notified.   Will come here for PT checks until after DCCV.  Then, will go back to PMD for management due to $35.00 co-pay.    Appended Document: 1 YR FU PER FEB REMINDER-SRS history and physical was reviewed.there have been no changes. The patient will proceed with cardioversion.

## 2011-01-20 NOTE — Medication Information (Signed)
Summary: ccr-lr  Anticoagulant Therapy  Managed by: Vashti Hey, RN PCP: Dr. Ernestine Conrad Supervising MD: Andee Lineman MD, Michelle Piper Indication 1: Atrial Fibrillation Lab Used: [facility] [place] Site: [place] INR POC 3.2  Dietary changes: no    Health status changes: no    Bleeding/hemorrhagic complications: no    Recent/future hospitalizations: no    Any changes in medication regimen? no    Recent/future dental: no  Any missed doses?: no       Is patient compliant with meds? yes       Allergies: No Known Drug Allergies  Anticoagulation Management History:      The patient is taking warfarin and comes in today for a routine follow up visit.  Positive risk factors for bleeding include an age of 20 years or older.  The bleeding index is 'intermediate risk'.  Positive CHADS2 values include Age > 17 years old.  His last INR was 3.1.  Anticoagulation responsible provider: Andee Lineman MD, Michelle Piper.  INR POC: 3.2.  Cuvette Lot#: [id number].  Exp: 07/2011.    Anticoagulation Management Assessment/Plan:      The patient's current anticoagulation dose is Coumadin 5 mg tabs: take as directed per PMD.  The target INR is 2.0-3.0.  The next INR is due 10/03/2010.  Anticoagulation instructions were given to patient.  Results were reviewed/authorized by Vashti Hey, RN.  He was notified by Vashti Hey RN.         Prior Anticoagulation Instructions: INR 2.4 Continue coumadin 2.5mg  once daily except 5mg  on Wednesdays  Current Anticoagulation Instructions: INR 3.2 Hold coumadin tonight then resueme 2.5mg  once daily except 5mg  on Wednesdays

## 2011-01-20 NOTE — Medication Information (Signed)
Summary: ccrv - pending DCCV in 4 wks.  --agh  Anticoagulant Therapy  Managed by: Vashti Hey, RN PCP: Dr. Ernestine Conrad Supervising MD: Myrtis Ser MD, Tinnie Gens Indication 1: Atrial Fibrillation Lab Used: [facility] [place] Site: [place] INR POC 2.8  Dietary changes: no    Health status changes: no    Bleeding/hemorrhagic complications: no    Recent/future hospitalizations: yes       Details: pending DCCV  Any changes in medication regimen? no    Recent/future dental: no  Any missed doses?: no       Is patient compliant with meds? yes       Allergies: No Known Drug Allergies  Anticoagulation Management History:      The patient is taking warfarin and comes in today for a routine follow up visit.  Positive risk factors for bleeding include an age of 75 years or older.  The bleeding index is 'intermediate risk'.  Negative CHADS2 values include Age > 105 years old.  His last INR was 3.1.  Anticoagulation responsible provider: Myrtis Ser MD, Tinnie Gens.  INR POC: 2.8.  Cuvette Lot#: [id number].    Anticoagulation Management Assessment/Plan:      The patient's current anticoagulation dose is Coumadin 5 mg tabs: take as directed per PMD.  The target INR is 2.0-3.0.  The next INR is due 03/14/2010.  Anticoagulation instructions were given to patient.  Results were reviewed/authorized by Vashti Hey, RN.         Prior Anticoagulation Instructions: INR 3.1 Continue coumadin 2.5mg  once daily except 5mg  on Mondays and Fridays  Current Anticoagulation Instructions: INR 2.8 Continue coumadin 2.5mg  once daily except 5mg  on Mondays and Fridays

## 2011-01-22 NOTE — Progress Notes (Signed)
Summary: patient walk in to have CCR  Phone Note Call from Patient Call back at Home Phone [phone]   Caller: patinet Reason for Call: Talk to Nurse Summary of Call: Ian [name] came in at 3:35 to have his coumadin checked. I explained to him that his appointment to have his levels checked with [place] wasnt until 12/30 and that we are not able to do walk-in checks.  Coumadin Clinic is held on Tuesday & Friday of each week.  He proceeded to tell me that we are not willing to accomadate his schedule and that if is unable to come on 12/30 do to work.  I offered to reschedule his appointment but he would not stated again that he was here today and that if we could not check today he would start having it check else where that would work with him.  Initial call taken by: Claudette Laws,  December 10, 2010 3:41 PM  Follow-up for Phone Call        called and talked with [name]. He explained that his work sched is difficult in allowing him time to get to the doctors' office.  I suggested he bring his work sched with him to the ccr appt. and his next ccr appointment can be sched on a day when he is off. Follow-up by: Alexis Goodell,  December 10, 2010 4:06 PM  Additional Follow-up for Phone Call Additional follow up Details #1::        Above noted.  In the past pt has been very difficult to deal with. Additional Follow-up by: Vashti Hey RN,  December 10, 2010 4:14 PM

## 2011-01-22 NOTE — Medication Information (Signed)
Summary: ccr-lr  Anticoagulant Therapy  Managed by: Vashti Hey, RN PCP: Dr. Ernestine Conrad Supervising MD: Diona Browner MD, Remi Deter Indication 1: Atrial Fibrillation Lab Used: [facility] [place] Site: [place] INR POC 2.6  Dietary changes: no    Health status changes: no    Bleeding/hemorrhagic complications: no    Recent/future hospitalizations: no    Any changes in medication regimen? no    Recent/future dental: no  Any missed doses?: yes     Details: missed 1 dose this week  Is patient compliant with meds? yes       Allergies: No Known Drug Allergies  Anticoagulation Management History:      The patient is taking warfarin and comes in today for a routine follow up visit.  Positive risk factors for bleeding include an age of 75 years or older.  The bleeding index is 'intermediate risk'.  Positive CHADS2 values include Age > 75 years old.  His last INR was 3.1.  Anticoagulation responsible provider: Diona Browner MD, Remi Deter.  INR POC: 2.6.  Cuvette Lot#: [id number].  Exp: 07/2011.    Anticoagulation Management Assessment/Plan:      The patient's current anticoagulation dose is Coumadin 5 mg tabs: take as directed per PMD.  The target INR is 2.0-3.0.  The next INR is due 01/16/2011.  Anticoagulation instructions were given to patient.  Results were reviewed/authorized by Vashti Hey, RN.  He was notified by Vashti Hey RN.         Prior Anticoagulation Instructions: INR 3.2 Hold coumadin toniight then resume 2.5mg  once daily except 5mg  on Wednesdays  Current Anticoagulation Instructions: INR 2.6 Continue coumadin 2.5mg  once daily except 5mg  on Wednesdays Pt will call to make follow up appt when he gets his work schedule from [employer].

## 2011-03-04 ENCOUNTER — Encounter: Payer: Self-pay | Admitting: Pharmacist

## 2011-03-10 NOTE — Letter (Signed)
Summary: [facility] - Delinquent Coumadin 1  [facility]  [address] [place] [address]   Phone: [phone]  Fax: [phone]     March 04, 2011 MRN: [id number]   Ian [name] [address] [place] [address] [place], [place]  [address]   Dear [name],  This letter is being sent to you as a reminder that it is necessary for you to get your INR/PT checked regularly so that we can optimize your care.  Our records indicate that you were scheduled to have a test done recently.  As of today, we have not received the results of this test.  It is very important that you have your INR checked.  Please call our office at the number listed above to schedule an appointment at your earliest convenience.    If you have recently had your protime checked or have discontinued this medication, please contact our office at the above phone number to clarify this issue.  Thank you for this prompt attention to this important health care matter.  Sincerely, Vashti Hey RN   [facility]

## 2011-03-20 ENCOUNTER — Ambulatory Visit: Payer: Self-pay | Admitting: *Deleted

## 2011-03-20 DIAGNOSIS — I4891 Unspecified atrial fibrillation: Secondary | ICD-10-CM

## 2011-04-06 ENCOUNTER — Encounter (INDEPENDENT_AMBULATORY_CARE_PROVIDER_SITE_OTHER): Payer: Self-pay

## 2011-04-06 DIAGNOSIS — R0989 Other specified symptoms and signs involving the circulatory and respiratory systems: Secondary | ICD-10-CM

## 2011-05-05 NOTE — Assessment & Plan Note (Signed)
[facility]                          [facility] NOTE   TC, KAPUSTA                       MRN:          [id number]  DATE:02/06/2009                            DOB:          September 25, 1933    REFERRING PHYSICIAN:  Kirstie Peri, MD   HISTORY OF PRESENT ILLNESS:  The patient is a 77 year old male with a  history of single-vessel coronary artery disease, treated medically.  The patient has been doing well.  He reports no chest pain, shortness of  breath, orthopnea, or PND.  He has not used any nitroglycerin.  He also  has not experienced no palpitations.  Last echocardiogram showed normal  LV function.  He does have his paroxysmal atrial fibrillation.   MEDICATIONS:  1. Aspirin 81 mg p.o. daily.  2. Coumadin 5 mg as directed.  3. IMPROVE-IT study drug.  4. Vitamin E.  5. Pindolol 5 mg p.o. daily.   PHYSICAL EXAMINATION:  VITAL SIGNS:  Blood pressure is 137/79, heart  rate is 53, and weight is 188 pounds.  NECK:  Normal carotid upstroke and no carotid bruits.  LUNGS:  Clear breath sounds bilaterally.  HEART:  Regular rate and rhythm.  Normal S1 and S2.  No murmurs, rubs,  or gallops.  ABDOMEN:  Soft and nontender.  No rebound or guarding.  Good bowel  sounds.  EXTREMITIES:  No cyanosis, clubbing, or edema.   PROBLEMS:  1. Single-vessel coronary artery disease, stable, treated medically.  2. Status post cardiac catheterization in December 2008.  3. Preserved left ventricular function.  4. Paroxysmal atrial fibrillation.      a.     Maintain sinus bradycardia on pindolol.      b.     Coumadin therapy.  5. Mild valvular heart disease.  6. Chronic left anterior fascicular block.   PLAN:  1. I have made no change in the patient's medical regimen.  I have      given new prescription for      sublingual nitroglycerin.  2. The patient will follow up with [place] in 1 year.     Learta Codding, MD,FACC  Electronically Signed    GED/MedQ  DD: 02/06/2009  DT: 02/06/2009  Job #: [id number]   cc:   Kirstie Peri, MD

## 2011-05-05 NOTE — Cardiovascular Report (Signed)
NAMETAKUMA, CIFELLI NO.:  [id number]   MEDICAL RECORD NO.:  [id number]          PATIENT TYPE:  OIB   LOCATION:  1963                         FACILITY:  [facility]   PHYSICIAN:  Noralyn Pick. [place] Emms, MD, FACCDATE OF BIRTH:  29-Mar-1933   DATE OF PROCEDURE:  12/06/2007  DATE OF DISCHARGE:                            CARDIAC CATHETERIZATION   Ian [name] is a 77 year old patient who has had recent onset chest pain.  His Myoview suggested inferolateral wall ischemia.  He has known  coronary artery disease by cath in 2004.  He had 90% apical LAD disease  and 80% small diagonal disease.  This was treated medically.   Catheterization was done with 4-French catheters from right femoral  artery.   Left main coronary artery was normal.   Left anterior descending artery had 30% tubular disease proximally.  The  mid to distal vessel had 70-80% eccentric disease which had progressed  from his previous cath.   The apical LAD lesion was unchanged at 90% and may be subtotally  occluded.   First diagonal branch had a 90% ostial lesion.  There is significant  calcification of the proximal LAD.  The second diagonal branch had 20%  ostial stenosis.   The circumflex coronary artery primarily consisted of a single obtuse  marginal branch.  There was 20-30% multiple discrete lesions through the  mid vessel.   The right coronary artery was dominant.  It was essentially normal in  the proximal, mid and distal portion.  PDA had 40% tubular disease.  There was a large PLA branch which was normal.  There were no right-to-  left collaterals seen.   RAO VENTRICULOGRAPHY:  RAO ventriculography was somewhat suboptimal due  to PVCs.  However, the EF was in the normal range at 55%.  There was no  MR.  Aortic pressure is 110/55, LV pressure is 110/5.   IMPRESSION:  The patient clearly has had anginal sounding chest pain  both with his stress test and at work.  I cannot explain why his Myoview  would have been read as inferolateral ischemia.  The OM branch and the  right do not have significant disease.  Despite the fact that the  patient may get angina from his diagonal and apical LAD  disease which is unchanged from 2004, I think there has been significant  progression in the mid to distal LAD.  This is his largest vessel and I  think should probably be stented.   The films will be reviewed with Dr. Riley Kill.  If so, we will proceed  with heparin and Plavix.      Noralyn Pick. [place] Emms, MD, [facility]  Electronically Signed     PCN/MEDQ  D:  12/06/2007  T:  12/06/2007  Job:  [id number]   cc:   Jonelle Sidle, MD

## 2011-05-05 NOTE — Discharge Summary (Signed)
Ian [name], Ian [name] NO.:  [id number]   MEDICAL RECORD NO.:  [id number]          PATIENT TYPE:  INP   LOCATION:  3731                         FACILITY:  [facility]   PHYSICIAN:  Jonelle Sidle, MD DATE OF BIRTH:  October 18, 1933   DATE OF ADMISSION:  12/06/2007  DATE OF DISCHARGE:  12/09/2007                               DISCHARGE SUMMARY   PRIMARY CARDIOLOGIST:  Dr. Diona Browner.   PRIMARY CARE Justice Milliron:  Dr. Kirstie Peri in [place], [place].   DISCHARGE DIAGNOSIS:  Unstable angina/coronary artery disease.   SECONDARY DIAGNOSES:  1. History of paroxysmal atrial fibrillation, currently in sinus      rhythm.  2. History of ventricular ectopy.  3. Hyperlipidemia.  4. Hypertension.  5. History of left anterior fascicular block.  6. Status post cholecystectomy.  7. Status post bilateral hernia repair.  8. Anemia.   ALLERGIES:  NO KNOWN DRUG ALLERGIES.   PROCEDURES:  Left heart cardiac catheterization.   HISTORY OF PRESENT ILLNESS:  A 77 year old male with prior history of  CAD and single-vessel disease which has been treated medically since  2004.  The patient was admitted from Dr. Margaretmary Eddy office to [facility] on November 30, 2007 secondary to recent recurrence of  exertional chest discomfort.  He ruled out for MI and underwent a stress  exercise stress perfusion study where he walked for 7 minutes, achieving  10.1 METS and 85% of his predicted maximal heart rate.  Perfusion  imaging showed moderate inferolateral ischemia with an EF of 56%.  After  being seen by Dr. Willa Rough, it was determined the patient could be  discharged from [facility] with follow-up outpatient  catheterization at [facility].   HOSPITAL COURSE:  The patient presented to the [facility] lab on December 16 for  left heart cardiac catheterization.  Catheterization revealed normal LV  function with an EF 55%, and coronary angiography showed 70-80% stenosis  and questionable  ruptured plaque in the LAD with 90% apical LAD disease.  There was also diagonal stenosis which was stable from prior heart  cardiac catheterization.  The patient otherwise had nonobstructive  disease.  Films were reviewed by Dr. Riley Kill, and it was felt that the  patient would likely benefit most from medical therapy given the diffuse  nature of the apical LAD disease.  The patient was initiated on statin  therapy and low-dose nitrate.  However, low-dose nitrate was  subsequently discontinued secondary to borderline low blood pressures.  We will plan for discharge today with continued aspirin, beta blocker  and statin therapy.  The patient will be seen by the Improve-It study  nurse for enrollment in the Improve-It study early next week.   DISCHARGE LABORATORY DATA:  Hemoglobin 10.8, hematocrit 31.1, WBC 6.0,  platelets 301, MCV 90.1.  Sodium 137, potassium 4.0, chloride 103, CO2  29, BUN 14, creatinine 0.72, glucose 96.  INR 1.1.  Calcium 8.8.  RBC  folate 939.  Vitamin B12 857, ferritin 307.  Serum iron 64, TIBC 223,  reticulocytes 1.3.   DISPOSITION:  The patient is being discharged home  today in good  condition.   FOLLOW-UP PLANS AND APPOINTMENTS:  The patient will follow up with  [facility] on December 22 at 9:15 a.m.  Follow-up with Dr. Andee Lineman on January 6 at 1:30 p.m. in our [place] office.   DISCHARGE MEDICATIONS:  1. Aspirin 81 mg daily.  2. Coumadin 5 mg as previously prescribed.  3. Acebutolol 200 mg b.i.d.  4. Improve-It study drug daily.  5. Nitroglycerin 0.4 mg sublingual p.r.n. chest pain.   OUTSTANDING LABORATORY STUDIES:  None.   DURATION OF DISCHARGE ENCOUNTER:  45 minutes including physician time.      Nicolasa Ducking, ANP      Jonelle Sidle, MD  Electronically Signed    CB/MEDQ  D:  12/09/2007  T:  12/10/2007  Job:  [id number]   cc:   Kirstie Peri, MD

## 2011-05-05 NOTE — Assessment & Plan Note (Signed)
[facility]                          [facility] NOTE   Ian [name], Ian [name]                       MRN:          [id number]  DATE:07/25/2008                            DOB:          09-17-1933    PRIMARY CARDIOLOGIST:  Jonelle Sidle, MD   REASON FOR VISIT:  Post hospital followup.   Ian [name] returns to our clinic, following a brief stay here at  [facility] for evaluation of dizziness and atypical chest pain.  He was  seen by [place] in consultation, ruled out for MI with all cardiac markers  within normal limits, and had a 2-D echo which showed continued  preserved LVEF (EF 50-55%) with mild mitral regurgitation and mild  eccentric aortic regurgitation.   Based on this finding, recommendation was to further evaluate for  possible endocarditis with some blood cultures.  These were drawn and  proved negative.   The patient was also assessed for possible orthostatic changes, and  there was no such evidence.  Medications were adjusted with substitution  of acebutolol with pindolol, with a dosage of 5 mg daily.   The patient now returns for post hospital followup; he reports having  experienced only 2 brief episodes of dizziness, with no associated  syncope and with no definite suggestion of vertigo.  He continues to  report no chest pain.  He denies any tachy palpitations.  In general, he  states that he is feeling much better and he is tolerating the change in  his medication, with no noted adverse effects.  Interestingly, he does  check his blood pressure and heart rate at home, citing pulse readings  in the 50 bpm range.   EKG in our office today shows persistent sinus bradycardia at 50 bpm  with chronic left anterior fascicular block and no acute changes.   MEDICATIONS:  1. Coumadin 5 mg as directed.  2. IMPROVE-IT study drug.  3. Aspirin 81 daily.  4. Pindolol 5 daily.   PHYSICAL EXAM:  VITAL SIGNS:  Blood pressure 118/73,  pulse 56 regular,  and weight 182.  GENERAL:  A 77 year old male, sitting upright, no distress.  HEENT:  Normocephalic, atraumatic.  NECK:  Palpable bilateral carotid pulse without bruits; no JVD at 90  degrees.  LUNGS:  Clear to auscultation in all fields.  HEART:  RRR (S1S2) soft, grade 1/6 holosystolic murmur at the base.  No  diastolic murmur.  No rubs.  ABDOMEN:  Soft, nontender.  EXTREMITIES:  No significant edema.  NEURO:  Somewhat flat affect, but no focal deficit.   IMPRESSION:  1. Single-vessel coronary artery disease, stable.      a.     Treated medically.      b.     Status post cardiac catheterization, December 2008.  2. Preserved left ventricular function.  3. History of paroxysmal atrial fibrillation.      a.     Maintaining sinus bradycardia on pindolol.  4. Mild valvular heart disease.      a.     Mitral regurgitation and aortic regurgitation.  5. Chronic left anterior fascicular  block.  6. Chronic Coumadin, followed by Dr. Sherryll Burger.   PLAN:  1. Continue current medication regimen.  2. Schedule return clinic follow up with myself and Dr. Diona Browner in 6      months, or sooner as needed.      Rozell Searing, PA-C  Electronically Signed      Jonelle Sidle, MD  Electronically Signed   GS/MedQ  DD: 07/25/2008  DT: 07/26/2008  Job #: [id number]   cc:   Kirstie Peri, MD

## 2011-05-05 NOTE — Assessment & Plan Note (Signed)
[facility]                          [facility]ICE NOTE   NATIVIDAD, HALLS                       MRN:          [id number]  DATE:12/27/2007                            DOB:          [birth year]    PRIMARY CARDIOLOGIST:  Dr. Simona Huh.   REASON FOR VISIT:  Post hospital follow-up.   The patient returns to the clinic after undergoing elective cardiac  catheterization at [facility] on December 06, 2007,  following a recent brief hospitalization here at [facility] for  evaluation of chest pain.  He has a history of moderate, single vessel  coronary artery disease by previous cardiac catheterization in 2004, as  well as paroxysmal atrial fibrillation on chronic Coumadin.  He was  referred to myself and Dr. Myrtis Ser in consultation for evaluation of chest  pain, ruled out for myocardial infarction with all serial cardiac  markers within normal limits, but had an abnormal stress Cardiolite  suggestive of moderate inferolateral ischemia.   Our recommendation was to proceed with diagnostic cardiac  catheterization the following day; however, the patient also presented  with anemia following a recent fall, with associated extensive  ecchymosis of the right lower extremity.  There was no associated  syncope, of note.  The patient was also not supratherapeutic with his  INR when he presented.   Following an extensive discussion with Dr. Shawnie Pons, Dr. Myrtis Ser  recommended that the patient could be cleared for discharge with plans  to proceed with an outpatient cardiac catheterization.  This, in fact,  took place four days after we saw him here in the hospital.   The diagnostic catheterization was performed by Dr. Shawnie Pons, who  noted some significant progression in the mid-distal LAD, but no  significant disease of the OM branch and the RCA.  The diagonal and  apical LAD disease appeared unchanged from 2004.   Given this anatomy,  Dr. Riley Kill could not explain why the perfusion study yielded  inferolateral ischemia. He was, however, concerned that the patient  might require stenting of the LAD but, following review of films with  colleagues, felt that the patient would most likely benefit from medical  therapy, given the diffuse nature of the apical LAD disease (90%).  Left  ventricular function was preserved (EF 55%).   The patient was thus started on Statin therapy and enrolled in the  IMPROVE-IT study.  He was also initially placed on a nitrate, but this  was subsequently discontinued secondary to relative hypotension.   The patient was discharged from [facility] on  hospital day #3.  With respect to the noted anemia, which was as low as  9.7 when he initially presented to [facility], this gradually  stabilized without requiring RBC transfusion.  He also had been  initially taken off of Coumadin, when seen by [place] in consultation, but  with recommendation to continue on aspirin pending further evaluation  with his catheterization.   Since his release from [facility], the patient has  not had any recurrent angina  pectoris.  He also has had complete  resolution of the right lower extremity ecchymosis.  He notes no  complications of the right groin incision site.   The patient has not had any follow-up CBC since December 09, 2007,  however.   The patient has been followed in our Coumadin Clinic since this  hospitalization and had an INR reading of 1.9 today.   Electrocardiogram today reveals sinus bradycardia at 55 bpm with left  axis deviation/LAHB, which is chronic, with no ischemic changes.   CURRENT MEDICATIONS:  1. Coumadin 5 mg as directed.  2. IMPROVE-IT study drug.  3. Acebutolol 200 mg b.i.d.  4. Aspirin 81 mg daily.   PHYSICAL EXAMINATION:  VITAL SIGNS:  Blood pressure 125/75, pulse 55,  regular.  GENERAL:  A 77 year old  male sitting upright in no distress.  HEENT:  Normocephalic and atraumatic.  NECK:  Palpable bilateral carotid pulse without bruits; no JVD.  LUNGS:  Clear to auscultation in all fields.  HEART:  Regular rate and rhythm (S1 and S2) a harsh 1/6 holosystolic  murmur heard loudest at the base.  Positive S4.  ABDOMEN:  Soft, nontender, intact bowel sounds.  EXTREMITIES:  Stable right groin with no ecchymosis, hematoma, or bruit  on auscultation; palpable femoral and distal pulses.  A complete  resolution of previously documented extensive ecchymosis of the right  lower extremity.  There is 1+ of bilateral pedal edema.  NEUROLOGIC:  No focal deficit.   IMPRESSION:  1. Noncritical coronary artery disease.      a.     Status post recent cardiac catheterization revealing       diffuse, high-grade apical left anterior descending disease,       medical therapy recommended.      b.     Residual nonobstructive circumflex and right coronary artery       disease.      c.     Preserved left ventricular function.      d.     History of moderate, single vessel coronary artery disease       with apical left anterior descending stenosis by cardiac       catheterization, September 2004.  2. History of paroxysmal atrial fibrillation.      a.     Maintaining sinus bradycardia, on acebutolol.  3. Chronic Coumadin.  4. Normocytic anemia.      a.     Secondary to recent fall; extensive right lower extremity       ecchymosis, now resolved.  5. Mild valvular heart disease.      a.     Mild mitral regurgitation/aortic regurgitation.  6. Dyslipidemia/low HDL.  7. Hypoalbuminemia.   PLAN:  1. Follow-up blood work with a CMET and CBC.  The patient has noted      hypoalbuminemia by recent blood work and I would like to follow up      on this, particularly in light of some mild, lower extremity edema.      I will also check a urinalysis to exclude proteinuria.  2. At this point in time, I have elected to not  add a long-acting      nitrate to the medication regimen, given that the patient has not      had any recurrent angina pectoris.  Also, he developed relative      hypotension when he was challenged with this at [facility].  I      discussed this in full with  him and he agrees with this plan.  3. Schedule return clinic follow-up with myself and Dr. Simona Huh      in approximately two months, with resumption of regular cardiology      follow-up with Dr. Simona Huh.      Gene Serpe, PA-C  Electronically Signed      Learta Codding, MD,FACC  Electronically Signed   GS/MedQ  DD: 12/27/2007  DT: 12/27/2007  Job #: [id number]   cc:   Kirstie Peri, MD

## 2011-05-05 NOTE — Assessment & Plan Note (Signed)
[facility]                          [facility] OFFICE NOTE   Ian [name], FECHER                         MRN:          [id number]  DATE:12/02/2007                            DOB:          1933/07/23    HOSPITAL CONSULT SUMMARY NOTE:   MOREHEAD RECORD NUMBER:  [id number].   Mr. Girgenti was admitted to [facility].  He did have some chest  pain.  A complete consultation note dated December 01, 2007, is  available.  We have followed the patient in the hospital.  He had a  stress Cardiolite scan.  There was no significant chest pain.  There was  no significant EKG change.  However, I felt that there was moderate  inferolateral ischemia.  Therefore, there was very careful consideration  to proceeding with catheterization to possibly be done today, December 02, 2007.  The patient was really willing to be transferred.  However,  his INR is 1.4 and, in fact, he received Coumadin 5 mg yesterday and his  INR would be increasing today.  This is complicated by the fact that  recently he fell (no syncope) with a significant hematoma in his right  leg.  His troponin here has been normal.  Therefore, I decided to review  all these issues with Dr. Riley Kill by phone and we both agree that it is  most prudent not to proceed with catheterization today but to arrange an  outpatient catheterization next week.  This note is being dictated to  help provide the most up-to-date information following the consult note.   PROBLEMS:  1. History of moderate single-vessel coronary disease by      catheterization in 2004.  The patient did have an abnormal      Cardiolite at that time.  There was question of an inferior and      peri-infarct ischemia at that time.  The ejection fraction was 47%.  2. Paroxysmal atrial fibrillation.  He has maintained sinus rhythm.      He has been on chronic Coumadin. His INR on admission was 1.8.  On      November 30, 2007, he received no  Coumadin but on December 11 he      received 5 mg of Coumadin and his INR is 1.4 today.  His Coumadin      will be held.  3. History of some ventricular ectopy.  4. Dyslipidemia with low HDL.  5. History of hypertension.  6. Status post cholecystectomy and bilateral hernia repair.  7. Recent fall while at work with a significant bruise to his right      leg.  This is improving.  There was question of significant blood      loss into this area when his first hemoglobin in the hospital was      9.7.  This is definitely reduced from several months ago.  However,      a repeat hemoglobin yesterday with no transfusion was 11.4,      followed by 10.6.  Therefore, it would appear that he has had some  blood loss into his leg but it is stable.  A decision about which      groin to use for his catheterization can be done at the time that      his catheterization is done.  8. Recent chest discomfort.  Normal troponins.  Inferolateral ischemia      by Cardiolite scan.   Today his INR is 1.4 but it may be rising.  Also with his recent bleed  into his leg, it does not appear to be prudent to be in a position of  needing to proceed with intervention when there has been some variation  in his hemoglobin and bleeding into his leg.  On the other hand, he did  present with some chest pain.  Considering all factors, I feel it is  best to keep him off Coumadin.  This will be safe for him.  Aspirin is  to be started, and he will remain on aspirin.  He will have an  outpatient cardiac catheterization.  Decisions can then be made about  how to proceed.  If he has no need for intervention, the issue is  resolved.  If there is need for intervention, the issues of his Coumadin  and hemoglobin drop/injury to his right leg can all be kept in mind.     Luis Abed, MD, [facility]  Electronically Signed    JDK/MedQ  DD: 12/02/2007  DT: 12/02/2007  Job #: [id number]

## 2011-05-08 NOTE — Discharge Summary (Signed)
NAME:  Ian [name], Ian [name] NO.:  [id number]   MEDICAL RECORD NO.:  [id number]                   PATIENT TYPE:  INP   LOCATION:  2007                                 FACILITY:  [facility]   PHYSICIAN:  Salvadore Farber, M.D.             DATE OF BIRTH:  [birth year]   DATE OF ADMISSION:  09/07/2003  DATE OF DISCHARGE:  09/10/2003                           DISCHARGE SUMMARY - REFERRING   DISCHARGE DIAGNOSES:  1. Chest pain.  2. Nonobstructive coronary artery disease.  3. History of paroxysmal atrial fibrillation, maintaining normal sinus     rhythm during this hospitalization.  4. Hyperlipidemia treated.  5. Hypertension treated.  6. Normal ejection fraction.  7. Long-term Coumadin use.  8. Status post cholecystectomy.  9. Status post bilateral hernia repair.  10.      Allergy to penicillin.   HOSPITAL COURSE:  Ian [name] is a 77 year old male patient who was seen in  consultation on September 06, 2003, for substernal chest pressure.  He was  admitted with chest discomfort with exertion accompanied by shortness of  breath.  His enzymes were negative and his EKG revealed a left anterior  fascicular block.  The plans were for the patient to be transferred to [facility] for further evaluation through cardiac  catheterization.   Catheterization occurred on September 10, 2003, and the patient was found to  have the following:  RCA with a distal 40% lesion, circumflex with 50%  proximal to mid lesion.  The LAD had a 30% lesion just prior to first  diagonal and another 30% lesion just after the first diagonal.  There was a  distal 70 to 80% lesion toward the apex.  The first diagonal had an 80%  lesion.  There was no MR noted.  EF was greater than 55% at this point and  we treated the patient medically.  He continues to have chest discomfort.  PCI can be obtained.   DISCHARGE MEDICATIONS:  These are the same as home medications and  include  his Coumadin, Mavik, baby aspirin, Lipitor, Sectral.  I will give him a  prescription for sublingual nitroglycerin as needed for chest pain.  He may  utilize Tylenol one or two tablets every six hours as needed for pain.   ACTIVITY:  No strenuous activity for two days and gradual increase activity.   DIET:  Remain on low fat diet.   DISCHARGE INSTRUCTIONS:  Clean over catheterization site with soap and  water.  Call for any questions or concerns.  He is to have a groin check on  September 19, 2003, at 1:15 p.m.  He is to have a Coumadin check on Thursday  or Friday of this week as well.    LABORATORY DATA:  Lab studies from the patient's stay include sodium 142,  potassium 3.9, BUN 12, creatinine 0.8.  White count 5.1, hemoglobin 13.6,  hematocrit  40.1, platelets 172.      Guy Franco, P.A. [facility]                      Salvadore Farber, M.D.    LB/MEDQ  D:  09/10/2003  T:  09/10/2003  Job:  [id number]   cc:   Jonelle Sidle, M.D. [facility]   Oneal Deputy. Juanetta Gosling, M.D.  [address]  [place]  [place] [address]  Fax: [phone]

## 2011-05-08 NOTE — Discharge Summary (Signed)
NAME:  Ian [name], Ian [name] NO.:  [id number]   MEDICAL RECORD NO.:  [id number]                   PATIENT TYPE:  INP   LOCATION:  A222                                 FACILITY:  [facility]   PHYSICIAN:  Sarita Bottom, M.D.                  DATE OF BIRTH:  06-Nov-1933   DATE OF ADMISSION:  02/02/2003  DATE OF DISCHARGE:  02/04/2003                                 DISCHARGE SUMMARY   DISCHARGE DIAGNOSES:  1. Chest pain, myocardial infarction ruled out.  2. Bronchitis, improved.  3. Supratherapeutic anticoagulation and coagulopathy, improved.  4. Hypertension.   DISCHARGE MEDICATIONS:  1. Finish previous doses of Zithromax.  2. Coumadin reduced to 2.5 mg.  3. Mavik 4 mg q.d.   HISTORY OF PRESENT ILLNESS:  The patient is a 77 year old man with a history  of paroxysmal atrial fibrillation on chronic anticoagulation who was  admitted to [facility] with complaint of chest pain and chest  congestion.   PHYSICAL EXAMINATION:  VITAL SIGNS:  Blood pressure 132/88, temperature  99.9, heart rate 68.  GENERAL:  He was not pale.  CHEST:  Clear to auscultation on admission.  ABDOMEN:  Benign.  NEUROLOGIC:  Alert and oriented x3.   LABORATORY DATA AND X-RAY FINDINGS:  Admission chest x-ray revealed no  infiltrate.  EKG with normal sinus rhythm with left ventricular hypertrophy.   His blood gas on room air showed a pH of 7.44, pCO2 45, pO2 73, oxygen  saturations 95%.  WBC 6.5, hemoglobin 13.5.  PT 29, INR 5.1.   IMPRESSION:  1. Bronchitis.  2. Chest pain.  3. Coagulopathy.  4. Paroxysmal atrial fibrillation.   HOSPITAL COURSE:  The patient was treated with Zithromax orally and given  Atrovent and albuterol nebulizers with oxygen.  His Coumadin doses were held  and he was also given Vitamin K 4 mg to correct his coagulopathy.  Serial  cardiac enzymes were done and MI was ruled out.  His clinical course was  significant for improvement of his congestion and  chest pain.  The patient  has been seen on rounds this morning.  He has no new complaints and admits  to improvement in his general well-being and his shortness of breath.  He  has no new chest pain.  His latest lab shows an INR of 2.2.  The patient  will be discharged home today.   FOLLOW UP:  Follow up with primary M.D., Dr. Juanetta Gosling.   SPECIAL INSTRUCTIONS:  Monitor patient's INR.                                               Sarita Bottom, M.D.    DW/MEDQ  D:  02/04/2003  T:  02/04/2003  Job:  [id number]

## 2011-05-08 NOTE — Cardiovascular Report (Signed)
NAME:  Ian [name], Ian [name] NO.:  [id number]   MEDICAL RECORD NO.:  [id number]                   PATIENT TYPE:  INP   LOCATION:  2007                                 FACILITY:  [facility]   PHYSICIAN:  Veneda Melter, M.D.                   DATE OF BIRTH:  08-22-1933   DATE OF PROCEDURE:  09/10/2003  DATE OF DISCHARGE:                              CARDIAC CATHETERIZATION   PROCEDURES PERFORMED:  1. Left heart catheterization.  2. Left ventriculogram.  3. Selective coronary angiography.   DIAGNOSES:  1. Coronary atherosclerotic disease.  2. Normal left ventricular systolic function.   HISTORY:  Mr. Filip is a 77 year old gentleman who presents with substernal  chest discomfort.  The patient was admitted to [facility] and  transferred to [facility] for further treatment.  The patient has had  stress imaging study performed in December 2002 which suggested an inferior  wall infarct with periinfarct ischemia and mild LV dysfunction.  At that  time he refused catheterization.  He has also had paroxysmal atrial  fibrillation with premature ventricular contractions as well.  Due to  recurrence of symptoms, he presents now for further cardiac assessment.   TECHNIQUE:  Informed consent was obtained.  The patient was brought to the  catheterization laboratory.  A 6-French sheath placed in the right femoral  artery using modified Seldinger technique.  A 6-French JL4 and JR4 catheter  was then used to engage the left and right coronary arteries.  Selective  angiography performed in various projections using manual injections of  contrast.  A 6-French pigtail catheter was then advanced to the left  ventricle and left ventriculogram performed using power injections of  contrast.  At the termination of this case catheters and sheath were  removed.  Manual pressure applied until adequate hemostasis achieved.  The  patient tolerated procedure well and was  transferred to floor in stable  condition.   FINDINGS:  1. Left main trunk:  Large caliber vessel with mild irregularities.  2. LAD:  Large caliber vessel that provides a small first diagonal branch in     the proximal segment and a large second diagonal branch in the mid     section.  The LAD has mild disease of 30-40% in the proximal mid section.     The distal LAD has a narrowing of 70% prior to the apex.  There is then     severe narrowing of 80% at the apex.  The first diagonal branch has     proximal narrowing of 80%.  The second diagonal branch has mild disease.  3. Left circumflex artery:  Medium caliber vessel.  Consists of a marginal     branch in the mid section.  The mid section of the circumflex artery has     moderate narrowing of 50%.  4. Right coronary artery:  Dominant.  This  is a large caliber vessel.     Provides a small posterior descending artery and a large posterior     ventricular branch in the terminal segment.  The right coronary artery     has mild diffuse disease of 30%.  The posterior descending artery has     mild narrowing of 40% in the mid section.  It tapers in the mid section     of the inferior septum.  5. LV:  Normal end-systolic and end-diastolic dimensions.  Overall left     ventricular function appears well preserved with ejection fraction     greater than 55%.  No mitral regurgitation.  LV pressure is 120/5.     Aortic is 120/70.  LVEDP equals 20.   ASSESSMENT/PLAN:  Mr. Bostelman is a 77 year old gentleman with moderate single  vessel coronary artery disease involving the apical left anterior  descending, the small first diagonal branch.  Due to high risk restenosis,  continued medical therapy will be recommended.  Should he have refractory  angina, percutaneous intervention may be considered.                                               Veneda Melter, M.D.    Melton Alar  D:  09/10/2003  T:  09/10/2003  Job:  [id number]   cc:   Jonelle Sidle,  M.D. [facility]   Oneal Deputy. Juanetta Gosling, M.D.  [address]  [place]  [place] [address]  Fax: [phone]   [facility]

## 2011-05-08 NOTE — H&P (Signed)
NAME:  Ian [name], Ian [name] NO.:  [id number]   MEDICAL RECORD NO.:  [id number]                   PATIENT TYPE:  INP   LOCATION:  A222                                 FACILITY:  [facility]   PHYSICIAN:  Sarita Bottom, M.D.                  DATE OF BIRTH:  24-Oct-1933   DATE OF ADMISSION:  02/02/2003  DATE OF DISCHARGE:                                HISTORY & PHYSICAL   PRIMARY CARE PHYSICIAN:  Oneal Deputy. Juanetta Gosling, M.D.   CHIEF COMPLAINT:  I feel congested, and I have some chest pain.   HISTORY OF PRESENT ILLNESS:  This patient is a 77 year old male with a  history of atrial fibrillation on anticoagulation.  He was well until about  4 days ago when he developed a sore throat which progressively got worse and  then later developed some congestion in his chest.  He called his primary  doctor who called in some antibiotics and some cough syrup for him, but he  never really felt any better.  He has also been having chest pain  bilaterally especially when he takes any deep breaths.  He therefore decided  to go to [facility] today.  The patient was then later  referred from [facility] to [facility] for  admission because of coagulopathy and also for chest pain.   REVIEW OF SYSTEMS:  He says that he feels feverish, but his temperature was  almost 99.  He denies any vomiting or diarrhea.  He denies any headache.  Denies any loss of weight.  Denies any shortness of breath.  Denies any  dizziness.  His appetite is good.  All other systems reviewed were negative.   PAST MEDICAL HISTORY:  He has atrial fibrillation.  He is on chronic  anticoagulation.  He has hypertension.  He has hyperlipidemia.   PAST SURGICAL HISTORY:  Had herniorrhaphy and cholecystectomy done.   MEDICATIONS:  1. Coumadin 5 mg daily.  2. Albuterol 200 mg b.i.d.  3. Mavik 4 mg daily.  4. Vitamin E.  5. He also takes aspirin.  6. Lipitor 10 mg daily.   FAMILY HISTORY:  He said his father had some problems with his heart, but is  not sure about the details.   SOCIAL HISTORY:  He is married with 3 children.  He does not smoke or drink  alcohol.  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   PHYSICAL EXAMINATION:  VITAL SIGNS:  Blood pressure 132/88, temperature  99.9, heart rate 68.  GENERAL:  He is an elderly man lying comfortably in bed not in any distress.  HEAD, EYES, EARS, NOSE, AND THROAT:  He is not pale.  He is anicteric.  His  oral mucosa is moist.  NECK:  His neck is supple.  There is no jugular venous distention.  CHEST:  Chest is clear to auscultation.  CARDIOVASCULAR:  Heart sounds 1 and 2 are normal.  The rhythm appears  regular.  ABDOMEN:  He has a small surgical scar.  He has no tenderness on deep  palpation.  CENTRAL NERVOUS SYSTEM:  He is alert and oriented x3.  EXTREMITIES:  He has no edema.   X-RAY AND LABORATORY DATA:  WBC is 6.5; hemoglobin is 13.5; hematocrit is  40.6; MCV is 89.1; platelets 235.  Sodium 139, potassium 4.3, chloride 107,  BUN of 8, creatinine of 0.8, CO2 of 32, glucose of 111.  PT is 29 and INR is  5.1.  ABG done from [facility] shows a pH of 7.44, pCO2 of  45, pO2 of 73, bicarb of 13.6.  Oxygen saturation of  95.  Chest x-ray is a  PA and lateral:  No cardiomegaly, no acute infiltrates.   ASSESSMENT:  1. Chest pain to rule out myocardial infarction.  2. Bronchitis.  3. Coagulopathy.  4. Hypertension.  5. Atrial fibrillation.   PLAN:  1. The patient is admitted to telemetry, reduce the narcotic as it is a     __________ acute myocardial infarction from his chest pain.  2. We will also put him on nebulization and oxygen and will continue with     Zithromax for the bronchitis.  3. With the coagulopathy the patient's Coumadin will be held on this     admission.  We will monitor his PT and INR and adjust his Coumadin dose     accordingly.  4.  For the atrial fibrillation we will repeat an EKG, at this time, and also     maintain him on telemetry.  5. With the hypertension the patient will be resumed on his Mavik 4 mg and     acebutolol 400 mg b.i.d.  6. Further workup will depend on the patient's clinical course.  __________     by Koleen Nimrod.                                               Sarita Bottom, M.D.    DW/MEDQ  D:  02/02/2003  T:  02/02/2003  Job:  [id number]

## 2011-09-25 LAB — IRON AND TIBC
Saturation Ratios: 29
UIBC: 159

## 2011-09-25 LAB — CBC
HCT: 29.9 — ABNORMAL LOW
HCT: 31.1 — ABNORMAL LOW
HCT: 31.9 — ABNORMAL LOW
Hemoglobin: 10.3 — ABNORMAL LOW
Hemoglobin: 10.8 — ABNORMAL LOW
Hemoglobin: 11.1 — ABNORMAL LOW
MCHC: 34.7
MCV: 90.1
Platelets: 305
Platelets: 313
RBC: 3.34 — ABNORMAL LOW
RBC: 3.54 — ABNORMAL LOW
RBC: 3.57 — ABNORMAL LOW
RDW: 13
WBC: 4.2
WBC: 5.7

## 2011-09-25 LAB — PROTIME-INR
INR: 1.1
Prothrombin Time: 14.7

## 2011-09-25 LAB — BASIC METABOLIC PANEL
BUN: 9
CO2: 29
Chloride: 102
Chloride: 103
Chloride: 105
GFR calc non Af Amer: >60
Glucose, Bld: 96
Glucose, Bld: 96
Potassium: 4
Potassium: 4.2
Potassium: 4.2
Sodium: 136
Sodium: 137

## 2011-09-25 LAB — FOLATE RBC: RBC Folate: 939 — ABNORMAL HIGH

## 2011-09-25 LAB — APTT: aPTT: 30

## 2011-09-25 LAB — ABO/RH: ABO/RH(D): A POS

## 2011-09-25 LAB — FERRITIN: Ferritin: 307 (ref 22–322)

## 2011-11-04 ENCOUNTER — Encounter: Payer: Self-pay | Admitting: *Deleted

## 2011-11-09 ENCOUNTER — Encounter: Payer: Self-pay | Admitting: Cardiology

## 2011-11-09 ENCOUNTER — Ambulatory Visit (INDEPENDENT_AMBULATORY_CARE_PROVIDER_SITE_OTHER): Payer: BC Managed Care – PPO | Admitting: Cardiology

## 2011-11-09 VITALS — BP 122/86 | HR 82 | Ht 72.0 in | Wt 202.0 lb

## 2011-11-09 DIAGNOSIS — I2510 Atherosclerotic heart disease of native coronary artery without angina pectoris: Secondary | ICD-10-CM

## 2011-11-09 DIAGNOSIS — I080 Rheumatic disorders of both mitral and aortic valves: Secondary | ICD-10-CM

## 2011-11-09 DIAGNOSIS — I4891 Unspecified atrial fibrillation: Secondary | ICD-10-CM

## 2011-11-09 NOTE — Assessment & Plan Note (Signed)
No significant murmur on exam and no evidence of significant mitral regurgitation by echocardiogram

## 2011-11-09 NOTE — Patient Instructions (Signed)
Continue all current medications. Your physician wants you to follow up in:  1 year.  You will receive a reminder letter in the mail one-two months in advance.  If you don't receive a letter, please call our office to schedule the follow up appointment   

## 2011-11-09 NOTE — Assessment & Plan Note (Signed)
No recurrent chest pain. No complaints of angina.

## 2011-11-09 NOTE — Assessment & Plan Note (Signed)
Patient today normal sinus rhythm. He appears that paroxysmal atrial fibrillation. He remains on Coumadin and is asymptomatic.

## 2011-11-09 NOTE — Progress Notes (Signed)
CC: Routine followup  HPI:  Patient presents for routine follow-up. The patent reports no  chest pain. The patient reports none palpitations. The patient reports none syncope. The patient is in NYHA class I. He has a history of paroxysmal atrial fibrillation on Coumadin therapy. He has a history of coronary artery disease with his last catheterization in 2008.    PMH: reviewed and listed in Problem List in Electronic Records (and see below) Past Medical History  Diagnosis Date  . Mitral valve insufficiency and aortic valve insufficiency   . Coronary atherosclerosis of native coronary artery     Diffuse high grade LAD disease-medical Rx recommended 12/08 ejection fraction 55-60% by echocardiogram   . Permanent atrial fibrillation     The patient ultimately declined cardioversion in April of 2011  . Left anterior fascicular block     chronic  . Hyperlipidemia      Allergies/SH/FHX : available in Electronic Records for review  Medications: Current Outpatient Prescriptions  Medication Sig Dispense Refill  . aspirin EC 81 MG tablet Take 81 mg by mouth daily.        . Multiple Vitamin (MULTIVITAMIN) tablet Take 1 tablet by mouth daily.        . nitroGLYCERIN (NITROSTAT) 0.4 MG SL tablet Place 0.4 mg under the tongue every 5 (five) minutes as needed.        . pindolol (VISKEN) 5 MG tablet Take 2.5 mg by mouth 2 (two) times daily.       . STUDY MEDICATION Take 1 tablet by mouth daily.        . vitamin E 400 UNIT capsule Take 400 Units by mouth daily.        Marland Kitchen warfarin (COUMADIN) 5 MG tablet Take 5 mg by mouth as directed. Managed by PMD         ROS: No nausea or vomiting. No fever or chills.No melena or hematochezia.No bleeding.No claudication  Physical Exam: Ht 6' (1.829 m)  Wt 202 lb (91.627 kg)  BMI 27.40 kg/m2 General: Well-nourished white male in no distress Neck: Normal carotid upstroke no carotid bruits. Lungs: Clear breath sounds bilaterally without wheezing Cardiac:  Regular rate and rhythm with normal S1-S2 and no pathological murmurs Vascular: No edema. Normal peripheral pulses Skin: Warm and dry.  12lead ECG: Normal sinus rhythm with left anterior hemiblock Limited bedside ECHO: Normal ejection fraction. Estimated at 55-60%   Assessment and Plan

## 2011-12-07 ENCOUNTER — Encounter (INDEPENDENT_AMBULATORY_CARE_PROVIDER_SITE_OTHER): Payer: BC Managed Care – PPO

## 2011-12-07 DIAGNOSIS — R0989 Other specified symptoms and signs involving the circulatory and respiratory systems: Secondary | ICD-10-CM

## 2011-12-29 DIAGNOSIS — R079 Chest pain, unspecified: Secondary | ICD-10-CM

## 2011-12-29 DIAGNOSIS — I4891 Unspecified atrial fibrillation: Secondary | ICD-10-CM

## 2011-12-30 DIAGNOSIS — I2510 Atherosclerotic heart disease of native coronary artery without angina pectoris: Secondary | ICD-10-CM

## 2012-01-11 ENCOUNTER — Ambulatory Visit: Payer: BC Managed Care – PPO

## 2012-01-11 NOTE — Progress Notes (Signed)
Pt in today for chemistry panel redraw for the Improve-It Study.

## 2012-01-13 ENCOUNTER — Other Ambulatory Visit: Payer: Self-pay | Admitting: *Deleted

## 2012-01-13 DIAGNOSIS — Z79899 Other long term (current) drug therapy: Secondary | ICD-10-CM

## 2012-01-13 DIAGNOSIS — E875 Hyperkalemia: Secondary | ICD-10-CM

## 2012-01-18 ENCOUNTER — Encounter: Payer: Self-pay | Admitting: Physician Assistant

## 2012-01-18 ENCOUNTER — Ambulatory Visit (INDEPENDENT_AMBULATORY_CARE_PROVIDER_SITE_OTHER): Payer: BC Managed Care – PPO | Admitting: Physician Assistant

## 2012-01-18 VITALS — BP 129/85 | HR 84 | Ht 72.0 in | Wt 201.0 lb

## 2012-01-18 DIAGNOSIS — I4891 Unspecified atrial fibrillation: Secondary | ICD-10-CM

## 2012-01-18 DIAGNOSIS — I2510 Atherosclerotic heart disease of native coronary artery without angina pectoris: Secondary | ICD-10-CM

## 2012-01-18 DIAGNOSIS — I080 Rheumatic disorders of both mitral and aortic valves: Secondary | ICD-10-CM

## 2012-01-18 MED ORDER — NITROGLYCERIN 0.4 MG SL SUBL: 0.4000 mg | SUBLINGUAL_TABLET | SUBLINGUAL | Status: AC | PRN

## 2012-01-18 NOTE — Patient Instructions (Signed)
Your physician wants you to follow-up in: 3 months. Someone from our office will contact you regarding this appointment. Stop Aspirin Nitroglycerin was refilled.

## 2012-01-18 NOTE — Assessment & Plan Note (Signed)
Patient remains in A. fib with CVR, controlled with pindolol. He is on chronic Coumadin, followed by Dr. Sherryll Burger. He no longer needs to be on concomitant ASA therapy, in the absence of any recent definite evidence of MI or active ischemia.

## 2012-01-18 NOTE — Progress Notes (Signed)
HPI: Patient presents for post hospital followup, from [facility].   Patient was admitted with atypical CP, dizziness, and recurrent PAF. He ruled out for MI, and was referred for a dobutamine stress echocardiogram, which was normal. Patient has history of PAF and is on chronic Coumadin, followed by Dr. Sherryll Burger. While in hospital, he remained in AF with CVR. He also had negative orthostatic BPs.  Since discharge, he denies any recurrent CP. 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 He denies any dizziness. He has since been seen in followup for an INR level, at Dr. Margaretmary Eddy office.  No Known Allergies  Current Outpatient Prescriptions  Medication Sig Dispense Refill  . aspirin EC 81 MG tablet Take 81 mg by mouth daily.        . Multiple Vitamin (MULTIVITAMIN) tablet Take 1 tablet by mouth daily.        . nitroGLYCERIN (NITROSTAT) 0.4 MG SL tablet Place 0.4 mg under the tongue every 5 (five) minutes as needed.        . pindolol (VISKEN) 5 MG tablet Take 2.5 mg by mouth 2 (two) times daily.       . STUDY MEDICATION Take 1 tablet by mouth daily.        . vitamin E 400 UNIT capsule Take 400 Units by mouth daily.        Marland Kitchen warfarin (COUMADIN) 5 MG tablet Take 5 mg by mouth as directed. Managed by PMD         Past Medical History  Diagnosis Date  . Mitral valve insufficiency and aortic valve insufficiency   . Coronary atherosclerosis of native coronary artery     Diffuse high grade LAD disease-medical Rx recommended 12/08  . Permanent atrial fibrillation     The patient ultimately declined cardioversion in April of 2011  . Left anterior fascicular block     chronic  . Hyperlipidemia     Past Surgical History  Procedure Date  . Cholecystectomy   . Hernia repair     BILATERAL    History   Social History  . Marital Status: Married    Spouse Name: N/A    Number of Children: N/A  . Years of Education: N/A   Occupational History  . Not on file.   Social History Main  Topics  . Smoking status: Never Smoker   . Smokeless tobacco: Never Used  . Alcohol Use: Not on file  . Drug Use: Not on file  . Sexually Active: Not on file   Other Topics Concern  . Not on file   Social History Narrative  . No narrative on file   Social History Narrative  . No narrative on file    Problem Relation Age of Onset  . Heart disease Father     ?    ROS: no nausea, vomiting; no fever, chills; no melena, hematochezia; no claudication  PHYSICAL EXAM: BP 129/85  Pulse 84  Ht 6' (1.829 m)  Wt 201 lb (91.173 kg)  BMI 27.26 kg/m2 GENERAL: 78 year old male, sitting upright; NAD HEENT: NCAT, PERRLA, EOMI; sclera clear; no xanthelasma NECK: palpable bilateral carotid pulses, no bruits; no JVD; no TM LUNGS: CTA bilaterally CARDIAC: Irregularly irregular (S1, S2); no significant murmurs; no rubs or gallops ABDOMEN: soft, non-tender; intact BS EXTREMETIES: intact distal pulses; no significant peripheral edema SKIN: warm/dry; no obvious rash/lesions MUSCULOSKELETAL: no joint deformity NEURO: no focal deficit; NL affect   EKG: reviewed and available in Electronic Records  ASSESSMENT & PLAN:

## 2012-01-18 NOTE — Assessment & Plan Note (Signed)
No further workup recommended. Patient presented recently with atypical CP, ruled out for MI, and had a normal dobutamine stress echocardiogram.

## 2012-01-18 NOTE — Assessment & Plan Note (Signed)
-   Continue to monitor clinically

## 2012-06-24 DIAGNOSIS — R42 Dizziness and giddiness: Secondary | ICD-10-CM

## 2013-01-02 ENCOUNTER — Encounter (INDEPENDENT_AMBULATORY_CARE_PROVIDER_SITE_OTHER): Payer: BC Managed Care – PPO

## 2013-01-02 DIAGNOSIS — R0989 Other specified symptoms and signs involving the circulatory and respiratory systems: Secondary | ICD-10-CM

## 2013-08-12 DIAGNOSIS — R5381 Other malaise: Secondary | ICD-10-CM

## 2013-08-12 DIAGNOSIS — R5383 Other fatigue: Secondary | ICD-10-CM

## 2013-10-04 ENCOUNTER — Ambulatory Visit (INDEPENDENT_AMBULATORY_CARE_PROVIDER_SITE_OTHER): Payer: Medicare Other | Admitting: Psychology

## 2013-10-04 DIAGNOSIS — F0391 Unspecified dementia with behavioral disturbance: Secondary | ICD-10-CM

## 2013-10-18 ENCOUNTER — Ambulatory Visit (INDEPENDENT_AMBULATORY_CARE_PROVIDER_SITE_OTHER): Payer: BC Managed Care – PPO | Admitting: Psychology

## 2013-10-18 DIAGNOSIS — F0280 Dementia in other diseases classified elsewhere without behavioral disturbance: Secondary | ICD-10-CM

## 2013-10-18 DIAGNOSIS — F0391 Unspecified dementia with behavioral disturbance: Secondary | ICD-10-CM

## 2013-10-26 ENCOUNTER — Encounter (HOSPITAL_COMMUNITY): Payer: Self-pay | Admitting: Psychology

## 2013-10-26 ENCOUNTER — Ambulatory Visit (INDEPENDENT_AMBULATORY_CARE_PROVIDER_SITE_OTHER): Payer: Medicare Other | Admitting: Psychology

## 2013-10-26 DIAGNOSIS — F0280 Dementia in other diseases classified elsewhere without behavioral disturbance: Secondary | ICD-10-CM

## 2013-10-26 NOTE — Progress Notes (Signed)
Today administered the CERAD neuropsychological battery to help differentiate various cortical dementia is better likely going on with the patient. The administration took 1 hour and only another hour applied   for interpretation and report witting.  The final report will be visible on his feedback session date which is scheduled for 10/26/2013

## 2013-10-26 NOTE — Progress Notes (Signed)
Patient:   Ian [name]   DOB:   [birth year]  MR Number:  [id number]  Location:  [name] [address] [place] [address] Dept: [phone]           Date of Service:   10/04/2013  Start Time:   11 AM End Time:   12 PM  Provider/Observer:  Ian Coria PSYD       Billing Code/Service: [phone]  Chief Complaint:     Chief Complaint  Patient presents with  . Memory Loss  . Agitation    Reason for Service:  The patient was referred by Dr. Sherryll Burger because of progressive decline in memory function, increasing in agitation, and symptoms of depression, anxiety, irritability, and other cognitive changes. The patient's wife comes in reports that she has seen a slow progressive decline over the past for 5 years. Initially started with some geographic disorientation and when the patient would become disoriented he would become very agitated. He then became more and more agitated over the years and having difficulty adjusting to things such as his wife's son coming to live with them. He has had a great deal of insomnia as well as muscle pain. The patient doesn't knowledge that he has been getting frustrated and agitated and he attributes most of this too hearing loss.  The patient's wife reports that he has been particularly agitated over the past couple of weeks. The patient's daughter was also there were the initial review and she acknowledges that she has noticed the patient becoming more confused getting twisted around. She reports that she started seeing difficulties about one year ago   Current Status:   the patient describes significant agitation as well as changes in his mood and mood stability. Insomnia, muscle pain other difficulties are also noted. Mental status questions during this clinical interview suggest that all of long-term memories are generally intact but he is having difficulty with new  information. The patient was not able to name the year, month or day and would not even hazard a guess. When asked what season it was he said that it was springtime when it is actually fall.   Reliability of Information:  information is provided by the patient as well as review of available medical records.the patient's wife and daughter were also present for this clinical interview.   Behavioral Observation: Ian [name]  presents as a 79 y.o.-year-old Right Caucasian Male who appeared his stated age. his dress was Appropriate and he was Well Groomed and his manners were Appropriate to the situation.  There were not any physical disabilities noted.  he displayed an appropriate level of cooperation and motivation.    Interactions:    Active  BUT WITH SOME AGITATION VARIOUS QUESTIONS AND SOME ATTEMPTS AT EXPLAINING ANY DIFFICULTY HAD OTHER FACTORS.  Attention:   The patient is clearly distracted by internal preoccupations and did have some issues with hearing loss potentially affecting his ability to attend to questions.  Memory:   abnormal  Visuo-spatial:   within normal limits  Speech (Volume):  normal  Speech:   normal pitch and normal volume  Thought Process:  Coherent  Though Content:  WNL  Orientation:   person, however, he was disoriented as to year, month, day, and season.   Judgment:   Fair  Planning:   Fair  Affect:    Angry and Defensive  Mood:    Anxious and Irritable  Insight:  Lacking  Intelligence:   normal  Marital Status/Living: the patient was born and raised in [place]. The patient has no living siblings and is married to his second wife. They have been married for about 10 years. The patient has 3 children all of whom are in their 51s. He currently lives with his wife and stepson who is had some difficulties with substance use task.   Current Employment:  the patient is not working at this time but has worked recently at [employer]. He is  retired.   Substance Use:  No concerns of substance abuse are reported.    Education:   HS Graduate  Medical History:   Past Medical History  Diagnosis Date  . Mitral valve insufficiency and aortic valve insufficiency   . Coronary atherosclerosis of native coronary artery     Diffuse high grade LAD disease-medical Rx recommended 12/08  . Permanent atrial fibrillation     The patient ultimately declined cardioversion in April of 2011  . Left anterior fascicular block     chronic  . Hyperlipidemia         Outpatient Encounter Prescriptions as of 10/04/2013  Medication Sig  . Multiple Vitamin (MULTIVITAMIN) tablet Take 1 tablet by mouth daily.    . nitroGLYCERIN (NITROSTAT) 0.4 MG SL tablet Place 1 tablet (0.4 mg total) under the tongue every 5 (five) minutes as needed.  . pindolol (VISKEN) 5 MG tablet Take 2.5 mg by mouth 2 (two) times daily.   . STUDY MEDICATION Take 1 tablet by mouth daily.    . vitamin E 400 UNIT capsule Take 400 Units by mouth daily.    Marland Kitchen warfarin (COUMADIN) 5 MG tablet Take 5 mg by mouth as directed. Managed by PMD           Sexual History:   History  Sexual Activity  . Sexual Activity: Not on file    Abuse/Trauma History:  the patient denies any history of abuse or trauma.   Psychiatric History:   the patient denies any prior history of psychiatric illness. However, with the development of his dementia he has been tried on Aricept as well as taking a very low doses there well for insomnia and agitation.   Family Med/Psych History:  Family History  Problem Relation Age of Onset  . Heart disease Father     ?    Risk of Suicide/Violence: The patient has been agitated but has not made any particular threats against his wife or other people he just becomes very frustrated and agitated. There are no indications of risk of suicide or homicide.   Impression/DX:   the patient has a for 5 years history of some slow stable target and overall cognitive  function. The first symptoms that were described were related to geographic disorientation and increasing agitation. He has had progressive changes in memory to the point that he is having difficulty remembering what happened in the very recent past. He does have good memory as far as long-standing information. There's been a slow steady decline. There are issues related to heart disease and high blood pressure he is taking a number of medications for cardiovascular issues. However, there is no indication of any strokes or other acute vascular events.   Disposition/Plan:   we will set the patient up a formal neuropsychological testing utilizing the CERAD   neuropsychological battery with intent to help differentiate between vascular dementia/multi-infarct dementia versus other cortical dementia is likely Alzheimer's.   Diagnosis:  Axis I:  Dementia with behavioral disturbance

## 2013-10-26 NOTE — Progress Notes (Signed)
GENERAL INTELLECTUAL FUNCTIONING:  The patient was initially administered the CERAD Neuropsychological Battery.  Below are the patient's scores in relationship to normative data and mild/moderate Senile Dementia of the Alzheimer's Type.      VF Name MMSE Const [facility] Exon:  6 15 23 11 6  0 5  Normal Age Matched 20 14.6 28.9 10.1 21.1 7.2 9.6  Mild SDAT:  8.8 11.8 20 7.8 8.8 0.9 4.8   Moderate SDAT 6.8 10.5 16.1 6.5 6.3 0.4 3.9   On the CERAD battery, which is a battery of measures shown to be sensitive to various forms of dementia, the patient's performance was generally consistent with those found in the moderate impairment group of Alzheimer's dementia.  Behavioral observations indicate that there were some clear cognitive difficulties during test administration, which would be consistent with his claimpaints and reports of difficulties in memory. For example, the patient ultimately is retired since our initial clinical interview when in fact he is retired prior to that and I've seen him only one week earlier. The patient displayed a number of times where he confabulated to some degree to try to minimize indications of cognitive difficulties. His performance on the Mini-Mental State Exam indicates improvements in his orientation to time and place from the initial clinical interview and he was able to correctly name the year, season, date, day of the week. However, he was unable to name who I was aware of the office was.  He was able to immediately recall 3 objects but was unable to recall any of the objects after a brief delay. he was able to spell world forwards but made initial errors in spelling world backwards but was eventually able to achieve that.  There were no significant aphasic or apraxic difficulties noted.    He was able to carry out a brief series of verbal instructions.   is performance on the constructional praxis test, a test sensitive to cortically based neurological  deficits, was   within normal limits and not  significantly impaired impaired for planning, visual spatial abilities. However, there were some indications of deficits with regard to fine motor coordination.  The patient's performance on the Verbal Fluency Test was significantly impaired and consistent with the moderate Alzheimer's population group.   His ability to name pictures of common and uncommon objects as measured by the [facility] Naming Test was  within normal limits and indicated that he was able to name objects continued to have severe difficulty with free recall of verbal fluency without queuing.    The patient's verbal learning and ability to encode new information was  significantly  significantly below and normative population and consistent levels achieved by individuals in the moderate Alzheimer's comparative group.  When words were initially presented over three separate trials,  his immediate recall was significantly impaired as he recalled 6 of the possible 30 words.   He was unable to recall any of the original 10 words after a brief delay, which is markedly impaired and consistent with the moderate Alzheimer's comparison group.  When the task was changed to a recognition task, he was only able to correctly identify 5 of the original 10 words and correctly identified 9 of the 10 words that were not on the original list.  This pattern suggests  memory impairments for both initial learning and holding onto new information, as well as significant deficits for storage, organization and retrieval of information. However, these memory deficits go well beyond impaired retrieval  problems and clearly indicate deficits with regard to storing and learning new information.  Overall, this performance indicates that the patient  is performing generally in the mild to moderate levels of cognitive performance consistent with those typically seen with Alzheimer's dementia. This would be a late onset.  However, we do not have repeat testing to make comparisons about progressive decline even though his wife and daughter both confirm of progressive and steady decline over the past for 5 years. The patient is doing better with regard to daily name he strategies and visual spatial abilities but there are clear indications and reports of geographic disorientation and confusion as well as changes in behavior and mood. He has become more agitated when he is frustrated and the patient acknowledges this and reports that this is unusual for him and he was never an angry person but when he gets very agitated it is hard for him to stop expressing. When asked about family history of Alzheimer's, the patient's wife reports that the patient's brother and nephew who is in his late 49s have both been diagnosed or died from Alzheimer's. The patient reports that he is unable to remember what happened to his brother and was unsure why his nephew was placed in the hospital/nursing home recently. His nephew would happen early onset Alzheimer's.   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 He does appear to be progressing fairly slowly and that the first symptoms are reported to of developed about 4 or 5 years ago. More behavioral disturbances has been noted over the past year. The patient does show some attempts to compensate for these weaknesses with confabulation but he doesn't knowledge that there is something significant going on. There is a family history in both his brother and nephew. The patient showed marked deficits with regard to verbal fluency and learning abilities but better target naming task.  We will look to repeat these measures in 6-9 months to assess for progressive changes or more definitive diagnosis at this time the most likely in consistent diagnosis would be one of  Alzheimer's.  As far as recommendations, I have talked with the patient's wife as well as the patient himself about the need to make significant planning efforts as to possible future places. We reviewed the possible expected course and stressed that at some point the patient's wife will not be able to provide all of the potential needs the patient will have. 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 I talked with both of them about strategies to keep the patient in the home as long as possible including good nutrition, physical activity, and as much cognitive activities such as reading and puzzles as possible. We have set up for repeat neuropsychological testing in approxi-7 months.

## 2014-06-08 ENCOUNTER — Ambulatory Visit (INDEPENDENT_AMBULATORY_CARE_PROVIDER_SITE_OTHER): Payer: BC Managed Care – PPO | Admitting: Psychology

## 2014-06-08 ENCOUNTER — Encounter (HOSPITAL_COMMUNITY): Payer: Self-pay | Admitting: Psychology

## 2014-06-08 DIAGNOSIS — G309 Alzheimer's disease, unspecified: Principal | ICD-10-CM

## 2014-06-08 DIAGNOSIS — F0280 Dementia in other diseases classified elsewhere without behavioral disturbance: Secondary | ICD-10-CM

## 2014-06-08 NOTE — Progress Notes (Signed)
Patient:   Ian [name]   DOB:   02-Jul-1933  MR Number:  [id number]  Location:  [facility] [address] [place] [address] Dept: [phone]           Date of Service:   06/08/2014  Start Time:   9 AM End Time:   10 AM  Adrien Shankar/Observer:  [name] PSYD       Billing Code/Service: [phone]  Chief Complaint:     Chief Complaint  Patient presents with  . Agitation  . Memory Loss    Reason for Service:  The patient was referred by Dr. Manuella Ghazi last year due to progressive decline in memory function, increasing in agitation, and symptoms of depression, anxiety, irritability, and other cognitive changes. The patient's wife reported that she has seen a slow progressive decline over the past for 5 years. Initially started with some geographic disorientation and when the patient would become disoriented he would become very agitated. He came more and more agitated over time and had difficulty adjusting to things requiring changes within the home. There've been a lot of insomnia initially but he has been reported as improving his sleep patterns. His wife reports he constantly complains about drinking too much.  During the clinical interview today the patient and his wife both report that his difficulties have gotten worse since I saw him last about 7 months ago. The patient's wife reports worsening of short-term memory difficulties as well as continued and worsening geographic disorientation. The patient is continuing to drive locally and there have been no accidents however, while he is driving finding from his home to places like [employer] he has gotten lost a couple of times returning from his daughter's home in [place] to the patient's home. No accidents have been reported. The patient's wife reports worsening of his expressive language abilities and increased word finding problems. Patient's  wife also reports that she notices him getting more frustrated when he has difficulties but reports that she does not see any significant depression or anxiety types of symptoms. Sleep is reported to be okay and currently his mood is pretty stable. The patient is getting little exercise other than riding a Conservation officer, nature . The patient spends his leisure time on his ham radio and making crafts.    Current Status:   the patient is described as having increasing short-term memory difficulties as well as some intermediate long-term memories from the previous year or so. The patient is having increasing expressive language difficulties that go beyond his hearing difficulties. The patient is also described as having increasing geographic disorientation.  Reliability of Information:  the information is provided by review of available medical records as well as clinical interview with both the patient and his wife.  Behavioral Observation: ASBURY HAIR  presents as a 80 y.o.-year-old Right Caucasian Male who appeared his stated age. his dress was Appropriate and he was Well Groomed and his manners were Appropriate to the situation.  There were not any physical disabilities noted.  he displayed an appropriate level of cooperation and motivation.    Interactions:    Active   Attention:   The patient is clearly distracted by his own internal thoughts and would get distracted at times.  Memory:   The patient would often answer her question about what was happening and his wife would need to correct him in caring ways. He clearly is having  some difficulty remembering the very recent past.  Visuo-spatial:   within normal limits  Speech (Volume):  low  Speech:   normal pitch  Thought Process:  Circumstantial  Though Content:  WNL  Orientation:   person and place but he did have to service with regard to time and situation.  The patient drove himself here with his wife driving another car patient reports that  halfway along to our office, which is not very far he started going to a different doctor's office and his wife had to call him to redirect him.   Judgment:   Fair  Planning:   Fair  Affect:    Excited  Mood:    No indications of significant anxiety or depression  Insight:   Fair  Intelligence:   normal   Medical History:   Past Medical History  Diagnosis Date  . Mitral valve insufficiency and aortic valve insufficiency   . Coronary atherosclerosis of native coronary artery     Diffuse high grade LAD disease-medical Rx recommended 12/08  . Permanent atrial fibrillation     The patient ultimately declined cardioversion in April of 2011  . Left anterior fascicular block     chronic  . Hyperlipidemia         Outpatient Encounter Prescriptions as of 06/08/2014  Medication Sig  . Multiple Vitamin (MULTIVITAMIN) tablet Take 1 tablet by mouth daily.    . nitroGLYCERIN (NITROSTAT) 0.4 MG SL tablet Place 1 tablet (0.4 mg total) under the tongue every 5 (five) minutes as needed.  . pindolol (VISKEN) 5 MG tablet Take 2.5 mg by mouth 2 (two) times daily.   . STUDY MEDICATION Take 1 tablet by mouth daily.    . vitamin E 400 UNIT capsule Take 400 Units by mouth daily.    Marland Kitchen warfarin (COUMADIN) 5 MG tablet Take 5 mg by mouth as directed. Managed by PMD     Risk of Suicide/Violence: virtually non-existent   Impression/DX:   the patient is continued a pattern consistent with dementia of the Alzheimer's type. It is reported that there is little bit less behavioral disturbance and agitation since the first time I saw him. This pattern of progressive changes consistent with this diagnosis.  Disposition/Plan:   we are setting the patient up for followup neuropsychological testing to make direct comparisons to his cognitive functioning 6 or 7 months ago.  Diagnosis:    Axis I:  Dementia of the Alzheimer's type       RODENBOUGH,JOHN R, PsyD 06/08/2014

## 2014-07-18 ENCOUNTER — Ambulatory Visit (HOSPITAL_COMMUNITY): Payer: Self-pay | Admitting: Psychology

## 2014-08-01 ENCOUNTER — Ambulatory Visit (HOSPITAL_COMMUNITY): Payer: Self-pay | Admitting: Psychology

## 2014-08-22 ENCOUNTER — Encounter (HOSPITAL_COMMUNITY): Payer: Self-pay | Admitting: Psychology

## 2014-08-22 ENCOUNTER — Ambulatory Visit (INDEPENDENT_AMBULATORY_CARE_PROVIDER_SITE_OTHER): Payer: BC Managed Care – PPO | Admitting: Psychology

## 2014-08-22 DIAGNOSIS — G309 Alzheimer's disease, unspecified: Principal | ICD-10-CM

## 2014-08-22 DIAGNOSIS — F0280 Dementia in other diseases classified elsewhere without behavioral disturbance: Secondary | ICD-10-CM

## 2014-08-22 NOTE — Progress Notes (Signed)
GENERAL INTELLECTUAL FUNCTIONING:  The patient was initially administered the CERAD Neuropsychological Battery in October 2014.  He has now retaken this same batter for direct comparisons.  The patient's scores are presented below for review.  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       VF Name MMSE [facility] Erline Levine  2014:   6  15  23  11  6   0  5 Ian [name] 08/22/2014: 5 15 18 10 5  0 4  Normal Age Matched  20 14.6 28.9 10.1 21.1 7.2 9.6  Mild SDAT:   8.8 11.8 20 7.8 8.8 0.9 4.8   Moderate SDAT  6.8 10.5 16.1 6.5 6.3 0.4 3.9   On the current CERAD battery, which is a battery of measures shown to be sensitive to various forms of dementia, the patient's performance was impaired and generally consistent with those found in the moderate group of Alzheimer's dementia.  Behavioral observations indicate that the patient had issues of confabulations and attempts to avoid or cover up difficulties and issues he is having.  There were clear issues with memory and orientation. His performance on the Mini-Mental State Exam indicates issues with orientation to time and place.  He did not know the season, month, date, day of week or much about where he was being tested.  He  was  able to immediately recall 3 objects but was unable to recall any after only a brief delay.  He was able to spell world forwards but have difficulty and errors when trying to spell "world" backwards.  There were no significant aphasic or apraxic difficulties noted but did show patterns suggest circumlocutions.  He had difficulties carrying out a brief series of verbal instructions.  His performance on the constructional praxis test, a test sensitive to cortically based neurological deficits, was  not significantly impaired impaired for planning, visual spatial abilities, and fine motor coordination but he did have some mild  errors and difficulties.  The patient's performance on the Verbal Fluency Test was significantly impaired and consistent with the moderate Alzheimer's population group.  His ability to name pictures of common and uncommon objects as measured by the [facility] Test was not significantly impaired.  He did do better with ques.     The patient's verbal learning and ability to encode new information was significantly impaired and  below and normative population.  His performance was consistent with levels achieved by individuals in the moderate Alzheimer's comparative group.  When words were initially presented over three separate trials, his immediate recall was significantly impaired as he recalled 5 of the possible 30 words.  He was unable to recall any of the original 10 words after a brief delay, which is markedly impaired and consistent with the moderate Alzheimer's comparison group.  When the task was changed to a recognition task, he was only able to correctly identified 4 of the original 10 words.  He essentially felt most of words were not on list.  This pattern suggests significant difficulties encoding and storing auditory information beyound just difficulties with recall of information.    Overall, this performance on the current testing indicates that the patient continues to display performance consistent with a moderate Alzheimer's population.   Significant impairments in Mental Status, verbal fluency, learning and memroy are all noted.      Summery and Conclusions:  Looking at the relationship between  the assessment last year and the current on, continue to suggest a progressive nature to his cognitive impairments.  There are patterns consistent with Dementia of the Alzheimer's type.  He has shown further decline in verbal fluency, MMSE, and learning and memory.  His family's reports are consistent with this progressive decline.  He is very resistant to acknowledging these  changes.  As far as recommendations, I would suggest that specific planning be done in preporation for the progressive nature of his situation.  I will provide feedback to the patient and his family regarding these findings and assist in planning.     [name], PsyD 08/22/2014

## 2014-09-10 ENCOUNTER — Encounter (HOSPITAL_COMMUNITY): Payer: Self-pay | Admitting: Psychology

## 2014-09-10 ENCOUNTER — Ambulatory Visit (INDEPENDENT_AMBULATORY_CARE_PROVIDER_SITE_OTHER): Payer: Medicare Other | Admitting: Psychology

## 2014-09-10 DIAGNOSIS — F03918 Unspecified dementia, unspecified severity, with other behavioral disturbance: Secondary | ICD-10-CM | POA: Diagnosis not present

## 2014-09-10 DIAGNOSIS — F0280 Dementia in other diseases classified elsewhere without behavioral disturbance: Secondary | ICD-10-CM

## 2014-09-10 DIAGNOSIS — G309 Alzheimer's disease, unspecified: Secondary | ICD-10-CM | POA: Diagnosis not present

## 2014-09-10 DIAGNOSIS — F0391 Unspecified dementia with behavioral disturbance: Secondary | ICD-10-CM

## 2014-09-10 NOTE — Progress Notes (Signed)
Today, I provided feedback regarding the results to the neuropsychological testing to the patient and his wife.  They both described situations related to geographic disorentiation and other memory problems that have occurred lately that are consistent with the current testing.  Below are the results of the formal testing.    GENERAL INTELLECTUAL FUNCTIONING:  The patient was initially administered the CERAD Neuropsychological Battery in October 2014.  He has now retaken this same batter for direct comparisons.  The patient's scores are presented below for review.  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       VF Name MMSE [facility] Erline Levine  2014:   6  15  23  11  6   0  5 Ian [name] 08/22/2014: 5 15 18 10 5  0 4  Normal Age Matched  20 14.6 28.9 10.1 21.1 7.2 9.6  Mild SDAT:   8.8 11.8 20 7.8 8.8 0.9 4.8   Moderate SDAT  6.8 10.5 16.1 6.5 6.3 0.4 3.9   On the current CERAD battery, which is a battery of measures shown to be sensitive to various forms of dementia, the patient's performance was impaired and generally consistent with those found in the moderate group of Alzheimer's dementia.  Behavioral observations indicate that the patient had issues of confabulations and attempts to avoid or cover up difficulties and issues he is having.  There were clear issues with memory and orientation. His performance on the Mini-Mental State Exam indicates issues with orientation to time and place.  He did not know the season, month, date, day of week or much about where he was being tested.  He  was  able to immediately recall 3 objects but was unable to recall any after only a brief delay.  He was able to spell world forwards but have difficulty and errors when trying to spell "world" backwards.  There were no significant aphasic or apraxic difficulties noted but did show patterns suggest  circumlocutions.  He had difficulties carrying out a brief series of verbal instructions.  His performance on the constructional praxis test, a test sensitive to cortically based neurological deficits, was  not significantly impaired impaired for planning, visual spatial abilities, and fine motor coordination but he did have some mild errors and difficulties.  The patient's performance on the Verbal Fluency Test was significantly impaired and consistent with the moderate Alzheimer's population group.  His ability to name pictures of common and uncommon objects as measured by the [facility] Test was not significantly impaired.  He did do better with ques.     The patient's verbal learning and ability to encode new information was significantly impaired and  below and normative population.  His performance was consistent with levels achieved by individuals in the moderate Alzheimer's comparative group.  When words were initially presented over three separate trials, his immediate recall was significantly impaired as he recalled 5 of the possible 30 words.  He was unable to recall any of the original 10 words after a brief delay, which is markedly impaired and consistent with the moderate Alzheimer's comparison group.  When the task was changed to a recognition task, he was only able to correctly identified 4 of the original 10 words.  He essentially felt most of words were not on list.  This pattern suggests significant difficulties encoding and storing auditory information beyound just difficulties with recall of information.  Overall, this performance on the current testing indicates that the patient continues to display performance consistent with a moderate Alzheimer's population.   Significant impairments in Mental Status, verbal fluency, learning and memroy are all noted.      Summery and Conclusions:  Looking at the relationship between the assessment last year and the current on,  continue to suggest a progressive nature to his cognitive impairments.  There are patterns consistent with Dementia of the Alzheimer's type.  He has shown further decline in verbal fluency, MMSE, and learning and memory.  His family's reports are consistent with this progressive decline.  He is very resistant to acknowledging these changes.  As far as recommendations, I would suggest that specific planning be done in preporation for the progressive nature of his situation.  I will provide feedback to the patient and his family regarding these findings and assist in planning.     Ian Roys, PsyD 09/10/2014

## 2016-04-01 ENCOUNTER — Encounter: Payer: Self-pay | Admitting: *Deleted

## 2016-04-01 ENCOUNTER — Ambulatory Visit (INDEPENDENT_AMBULATORY_CARE_PROVIDER_SITE_OTHER): Payer: Medicare Other | Admitting: Cardiovascular Disease

## 2016-04-01 ENCOUNTER — Encounter: Payer: Self-pay | Admitting: Cardiovascular Disease

## 2016-04-01 VITALS — BP 128/80 | HR 92 | Ht 72.0 in | Wt 234.0 lb

## 2016-04-01 DIAGNOSIS — I482 Chronic atrial fibrillation: Secondary | ICD-10-CM | POA: Diagnosis not present

## 2016-04-01 DIAGNOSIS — I25118 Atherosclerotic heart disease of native coronary artery with other forms of angina pectoris: Secondary | ICD-10-CM | POA: Diagnosis not present

## 2016-04-01 DIAGNOSIS — I080 Rheumatic disorders of both mitral and aortic valves: Secondary | ICD-10-CM

## 2016-04-01 DIAGNOSIS — I4891 Unspecified atrial fibrillation: Secondary | ICD-10-CM | POA: Diagnosis not present

## 2016-04-01 DIAGNOSIS — I4821 Permanent atrial fibrillation: Secondary | ICD-10-CM

## 2016-04-01 DIAGNOSIS — R5383 Other fatigue: Secondary | ICD-10-CM

## 2016-04-01 NOTE — Patient Instructions (Signed)
Your physician recommends that you continue on your current medications as directed. Please refer to the Current Medication list given to you today. Your physician has requested that you have an echocardiogram. Echocardiography is a painless test that uses sound waves to create images of your heart. It provides your doctor with information about the size and shape of your heart and how well your heart's chambers and valves are working. This procedure takes approximately one hour. There are no restrictions for this procedure. Your physician has requested that you have a lexiscan myoview. For further information please visit [email or web address]. Please follow instruction sheet, as given. Your physician recommends that you schedule a follow-up appointment in: 2 months.

## 2016-04-01 NOTE — Progress Notes (Signed)
Patient ID: Ian [name], male   DOB: 1933-03-06, 82 y.o.   MRN: [id number]       CARDIOLOGY CONSULT NOTE  Patient ID: Ian [name] MRN: [id number] DOB/AGE: [birth year] 82 y.o.  Admit date: (Not on file) Primary Physician Monico Blitz, MD  Reason for Consultation: CAD, atrial fibrillation  HPI: The patient is an 82 year old male who presents for the evaluation of atrial fibrillation and coronary artery disease. He is anticoagulated with warfarin and INR is managed by his PCP, Dr. Manuella Ghazi.  He underwent coronary angiography on 09/10/03 which demonstrated mid LAD stenosis of 30-40% with distal LAD narrowing of 70% prior to the apex. There was then severe narrowing of 80% at the apex. A small first diagonal branch had proximal 80% stenosis. The mid circumflex had 50% stenosis and the RCA had mild diffuse disease of 30%. Left ventriculography demonstrated normal left ventricular systolic function and no mitral regurgitation. Medical therapy was recommended.  Echocardiogram in October 2012 demonstrated normal left ventricular systolic function, EF 0000000, mild mitral and tricuspid regurgitation and mild to moderate aortic regurgitation.  Review of labs performed on 03/11/16 showed BUN 15, creatinine 0.96, TSH 1.3, hemoglobin 14.9.  He has dementia. He denies chest pain and shortness of breath and says he does not feel tired out, but his wife says that he has progressively become more fatigued over the past year. He sits and does puzzles all day and does not exercise. He denies leg swelling.  ECG today shows atrial fibrillation with right bundle-branch block and left anterior fascicular block, heart rate 85 bpm.  Soc: Wife has worked at [employer] yrs.  No Known Allergies  Current Outpatient Prescriptions  Medication Sig Dispense Refill  . metoprolol succinate (TOPROL-XL) 50 MG 24 hr tablet Take 50 mg by mouth daily. Take with or immediately following a meal.    . Multiple Vitamin  (MULTIVITAMIN) tablet Take 1 tablet by mouth daily.      . nitroGLYCERIN (NITROSTAT) 0.4 MG SL tablet Place 1 tablet (0.4 mg total) under the tongue every 5 (five) minutes as needed. 25 tablet 3  . vitamin E 400 UNIT capsule Take 400 Units by mouth daily.      Marland Kitchen warfarin (COUMADIN) 5 MG tablet Take 5 mg by mouth as directed. Managed by PMD      No current facility-administered medications for this visit.    Past Medical History  Diagnosis Date  . Mitral valve insufficiency and aortic valve insufficiency   . Coronary atherosclerosis of native coronary artery     Diffuse high grade LAD disease-medical Rx recommended 12/08  . Permanent atrial fibrillation [facility])     The patient ultimately declined cardioversion in April of 2011  . Left anterior fascicular block     chronic  . Hyperlipidemia   . Dementia of the Alzheimer's type     Past Surgical History  Procedure Laterality Date  . Cholecystectomy    . Hernia repair      BILATERAL    Social History   Social History  . Marital Status: Married    Spouse Name: N/A  . Number of Children: N/A  . Years of Education: N/A   Occupational History  . Not on file.   Social History Main Topics  . Smoking status: Never Smoker   . Smokeless tobacco: Never Used  . Alcohol Use: Not on file  . Drug Use: Not on file  . Sexual Activity: Not on file   Other Topics Concern  .  Not on file   Social History Narrative     No family history of premature CAD in 1st degree relatives.  Prior to Admission medications   Medication Sig Start Date End Date Taking? Authorizing Provider  Multiple Vitamin (MULTIVITAMIN) tablet Take 1 tablet by mouth daily.      Historical Provider, MD  nitroGLYCERIN (NITROSTAT) 0.4 MG SL tablet Place 1 tablet (0.4 mg total) under the tongue every 5 (five) minutes as needed. 01/18/12   Donney Dice, PA-C  pindolol (VISKEN) 5 MG tablet Take 2.5 mg by mouth 2 (two) times daily.     Historical Provider, MD  STUDY  MEDICATION Take 1 tablet by mouth daily.      Historical Provider, MD  vitamin E 400 UNIT capsule Take 400 Units by mouth daily.      Historical Provider, MD  warfarin (COUMADIN) 5 MG tablet Take 5 mg by mouth as directed. Managed by PMD     Historical Provider, MD     Review of systems complete and found to be negative unless listed above in HPI     Physical exam Blood pressure 128/80, pulse 92, height 6' (1.829 m), weight 234 lb (106.142 kg), SpO2 97 %. General: NAD Neck: No JVD, no thyromegaly or thyroid nodule.  Lungs: Clear to auscultation bilaterally with normal respiratory effort. CV: Nondisplaced PMI. Regular rate and irregular rhythm, normal S1/S2, no S3, no murmur.  No peripheral edema.  No carotid bruit.    Abdomen: Soft, nontender, no distention.  Skin: Intact without lesions or rashes.  Neurologic: Alert and oriented x 3.  Psych: Normal affect. Extremities: No clubbing or cyanosis.  HEENT: Normal.   ECG: Most recent ECG reviewed.  Labs:   Lab Results  Component Value Date   WBC 6.0 12/09/2007   HGB 10.8* 12/09/2007   HCT 31.1* 12/09/2007   MCV 90.1 12/09/2007   PLT 301 12/09/2007   No results for input(s): NA, K, CL, CO2, BUN, CREATININE, CALCIUM, PROT, BILITOT, ALKPHOS, ALT, AST, GLUCOSE in the last 168 hours.  Invalid input(s): LABALBU No results found for: CKTOTAL, CKMB, CKMBINDEX, TROPONINI No results found for: CHOL No results found for: HDL No results found for: LDLCALC No results found for: TRIG No results found for: CHOLHDL No results found for: LDLDIRECT       Studies: No results found.  ASSESSMENT AND PLAN:  1. Atrial fibrillation: Treated with Toprol-XL and warfarin. HR is controlled.  2. Fatigue in context of CAD: On a beta blocker. No ASA as he is on warfarin. I will proceed with a nuclear myocardial perfusion imaging study to evaluate for ischemic heart disease (hemodynamic progression of previously documented lesions).   3. Valvular  heart disease: Will obtain echocardiogram to assess for interval changes.  Dispo: fu 2 months.   Signed: Kate Sable, M.D., F.A.C.C.  04/01/2016, 8:51 AM

## 2016-04-06 ENCOUNTER — Encounter (HOSPITAL_COMMUNITY)
Admission: RE | Admit: 2016-04-06 | Discharge: 2016-04-06 | Disposition: A | Discharge status: Home / Self Care | Payer: Medicare Other | Source: Ambulatory Visit | Attending: Cardiovascular Disease | Admitting: Cardiovascular Disease

## 2016-04-06 ENCOUNTER — Ambulatory Visit (HOSPITAL_COMMUNITY)
Admission: RE | Admit: 2016-04-06 | Discharge: 2016-04-06 | Disposition: A | Discharge status: Home / Self Care | Payer: Medicare Other | Source: Ambulatory Visit | Attending: Cardiovascular Disease | Admitting: Cardiovascular Disease

## 2016-04-06 ENCOUNTER — Encounter (HOSPITAL_COMMUNITY): Payer: Self-pay

## 2016-04-06 DIAGNOSIS — I77810 Thoracic aortic ectasia: Secondary | ICD-10-CM | POA: Diagnosis not present

## 2016-04-06 DIAGNOSIS — I25118 Atherosclerotic heart disease of native coronary artery with other forms of angina pectoris: Secondary | ICD-10-CM | POA: Diagnosis not present

## 2016-04-06 DIAGNOSIS — I080 Rheumatic disorders of both mitral and aortic valves: Secondary | ICD-10-CM

## 2016-04-06 DIAGNOSIS — I517 Cardiomegaly: Secondary | ICD-10-CM | POA: Diagnosis not present

## 2016-04-06 DIAGNOSIS — I351 Nonrheumatic aortic (valve) insufficiency: Secondary | ICD-10-CM | POA: Insufficient documentation

## 2016-04-06 DIAGNOSIS — I340 Nonrheumatic mitral (valve) insufficiency: Secondary | ICD-10-CM | POA: Insufficient documentation

## 2016-04-06 DIAGNOSIS — I071 Rheumatic tricuspid insufficiency: Secondary | ICD-10-CM | POA: Diagnosis not present

## 2016-04-06 DIAGNOSIS — I2510 Atherosclerotic heart disease of native coronary artery without angina pectoris: Secondary | ICD-10-CM | POA: Diagnosis not present

## 2016-04-06 DIAGNOSIS — I059 Rheumatic mitral valve disease, unspecified: Secondary | ICD-10-CM | POA: Diagnosis present

## 2016-04-06 DIAGNOSIS — R5383 Other fatigue: Secondary | ICD-10-CM | POA: Insufficient documentation

## 2016-04-06 LAB — NM MYOCAR MULTI W/SPECT W/WALL MOTION / EF
CHL CUP NUCLEAR SSS: 12
LHR: 0.3
LVDIAVOL: 93 mL (ref 62–150)
LVSYSVOL: 45 mL
NUC STRESS TID: 1.02
Peak HR: 104 {beats}/min
Rest HR: 82 {beats}/min
SDS: 4
SRS: 8

## 2016-04-06 MED ORDER — TECHNETIUM TC 99M SESTAMIBI GENERIC - CARDIOLITE
30.0000 | Freq: Once | INTRAVENOUS | Status: AC | PRN
  Administered 2016-04-06: 32 via INTRAVENOUS

## 2016-04-06 MED ORDER — SODIUM CHLORIDE 0.9% FLUSH
INTRAVENOUS | Status: AC
  Filled 2016-04-06: qty 160

## 2016-04-06 MED ORDER — TECHNETIUM TC 99M SESTAMIBI - CARDIOLITE
10.0000 | Freq: Once | INTRAVENOUS | Status: AC | PRN
  Administered 2016-04-06: 10.3 via INTRAVENOUS

## 2016-04-06 MED ORDER — REGADENOSON 0.4 MG/5ML IV SOLN
INTRAVENOUS | Status: AC
  Administered 2016-04-06: 0.4 mg
  Filled 2016-04-06: qty 5

## 2016-05-26 ENCOUNTER — Ambulatory Visit: Payer: Medicare Other | Admitting: Cardiovascular Disease

## 2016-06-02 ENCOUNTER — Ambulatory Visit: Payer: Self-pay | Admitting: Cardiovascular Disease

## 2016-07-13 NOTE — Progress Notes (Signed)
This encounter was created in error - please disregard.

## 2016-11-11 ENCOUNTER — Other Ambulatory Visit (HOSPITAL_COMMUNITY): Payer: Self-pay | Admitting: Nurse Practitioner

## 2016-11-11 ENCOUNTER — Ambulatory Visit (HOSPITAL_COMMUNITY)
Admission: RE | Admit: 2016-11-11 | Discharge: 2016-11-11 | Disposition: A | Discharge status: Home / Self Care | Payer: Medicare Other | Source: Ambulatory Visit | Attending: Nurse Practitioner | Admitting: Nurse Practitioner

## 2016-11-11 DIAGNOSIS — T148XXA Other injury of unspecified body region, initial encounter: Secondary | ICD-10-CM

## 2016-11-11 DIAGNOSIS — S7011XA Contusion of right thigh, initial encounter: Secondary | ICD-10-CM | POA: Insufficient documentation

## 2016-11-11 DIAGNOSIS — M7989 Other specified soft tissue disorders: Secondary | ICD-10-CM | POA: Diagnosis present

## 2016-11-11 DIAGNOSIS — X58XXXA Exposure to other specified factors, initial encounter: Secondary | ICD-10-CM | POA: Insufficient documentation

## 2017-01-01 DIAGNOSIS — R509 Fever, unspecified: Secondary | ICD-10-CM | POA: Diagnosis not present

## 2017-01-01 DIAGNOSIS — Z713 Dietary counseling and surveillance: Secondary | ICD-10-CM | POA: Diagnosis not present

## 2017-01-01 DIAGNOSIS — J069 Acute upper respiratory infection, unspecified: Secondary | ICD-10-CM | POA: Diagnosis not present

## 2017-01-01 DIAGNOSIS — I10 Essential (primary) hypertension: Secondary | ICD-10-CM | POA: Diagnosis not present

## 2017-01-01 DIAGNOSIS — F0280 Dementia in other diseases classified elsewhere without behavioral disturbance: Secondary | ICD-10-CM | POA: Diagnosis not present

## 2017-01-01 DIAGNOSIS — E7800 Pure hypercholesterolemia, unspecified: Secondary | ICD-10-CM | POA: Diagnosis not present

## 2017-01-01 DIAGNOSIS — I482 Chronic atrial fibrillation: Secondary | ICD-10-CM | POA: Diagnosis not present

## 2017-01-01 DIAGNOSIS — Z789 Other specified health status: Secondary | ICD-10-CM | POA: Diagnosis not present

## 2017-01-01 DIAGNOSIS — Z299 Encounter for prophylactic measures, unspecified: Secondary | ICD-10-CM | POA: Diagnosis not present

## 2017-01-01 DIAGNOSIS — G309 Alzheimer's disease, unspecified: Secondary | ICD-10-CM | POA: Diagnosis not present

## 2017-02-05 DIAGNOSIS — Z299 Encounter for prophylactic measures, unspecified: Secondary | ICD-10-CM | POA: Diagnosis not present

## 2017-02-05 DIAGNOSIS — E7800 Pure hypercholesterolemia, unspecified: Secondary | ICD-10-CM | POA: Diagnosis not present

## 2017-02-05 DIAGNOSIS — Z6836 Body mass index (BMI) 36.0-36.9, adult: Secondary | ICD-10-CM | POA: Diagnosis not present

## 2017-02-05 DIAGNOSIS — I482 Chronic atrial fibrillation: Secondary | ICD-10-CM | POA: Diagnosis not present

## 2017-02-05 DIAGNOSIS — Z713 Dietary counseling and surveillance: Secondary | ICD-10-CM | POA: Diagnosis not present

## 2017-03-15 DIAGNOSIS — Z713 Dietary counseling and surveillance: Secondary | ICD-10-CM | POA: Diagnosis not present

## 2017-03-15 DIAGNOSIS — I482 Chronic atrial fibrillation: Secondary | ICD-10-CM | POA: Diagnosis not present

## 2017-03-15 DIAGNOSIS — Z6836 Body mass index (BMI) 36.0-36.9, adult: Secondary | ICD-10-CM | POA: Diagnosis not present

## 2017-04-12 DIAGNOSIS — I951 Orthostatic hypotension: Secondary | ICD-10-CM | POA: Diagnosis not present

## 2017-04-12 DIAGNOSIS — F0390 Unspecified dementia without behavioral disturbance: Secondary | ICD-10-CM | POA: Diagnosis not present

## 2017-04-12 DIAGNOSIS — I10 Essential (primary) hypertension: Secondary | ICD-10-CM | POA: Diagnosis not present

## 2017-04-12 DIAGNOSIS — I739 Peripheral vascular disease, unspecified: Secondary | ICD-10-CM | POA: Diagnosis not present

## 2017-04-12 DIAGNOSIS — Z6832 Body mass index (BMI) 32.0-32.9, adult: Secondary | ICD-10-CM | POA: Diagnosis not present

## 2017-04-12 DIAGNOSIS — F0280 Dementia in other diseases classified elsewhere without behavioral disturbance: Secondary | ICD-10-CM | POA: Diagnosis not present

## 2017-04-12 DIAGNOSIS — E7800 Pure hypercholesterolemia, unspecified: Secondary | ICD-10-CM | POA: Diagnosis not present

## 2017-04-12 DIAGNOSIS — I482 Chronic atrial fibrillation: Secondary | ICD-10-CM | POA: Diagnosis not present

## 2017-04-12 DIAGNOSIS — G309 Alzheimer's disease, unspecified: Secondary | ICD-10-CM | POA: Diagnosis not present

## 2017-04-12 DIAGNOSIS — Z299 Encounter for prophylactic measures, unspecified: Secondary | ICD-10-CM | POA: Diagnosis not present

## 2017-04-12 DIAGNOSIS — I2510 Atherosclerotic heart disease of native coronary artery without angina pectoris: Secondary | ICD-10-CM | POA: Diagnosis not present

## 2017-05-04 IMAGING — US US EXTREM LOW VENOUS*R*
1 series · 13 of 24 positions shown · non-contrast
Comparison: None.

CLINICAL DATA: Right lower extremity pain and edema for the past
week. Patient is on anti coagulation with concern for lower
extremity hematoma. Evaluate for DVT.



[Series 1: us extrem low venous*right* · 0.08mm/px · 45 acquisitions, 13 frames shown]
[im 1/45]
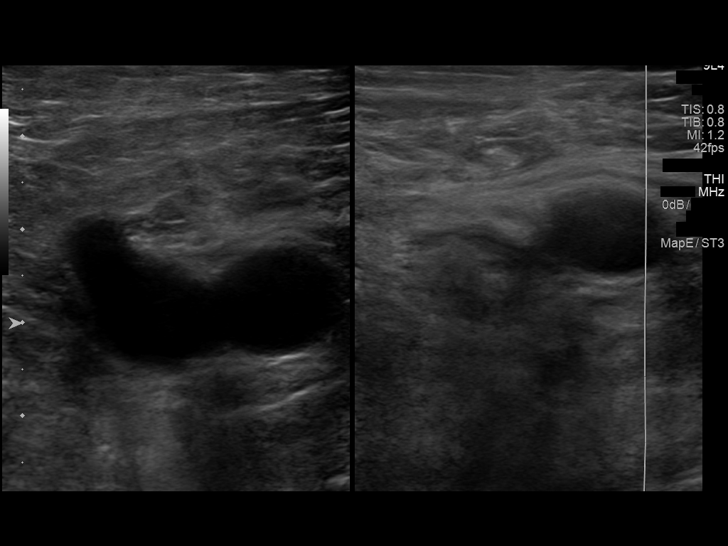
[im 4/45]
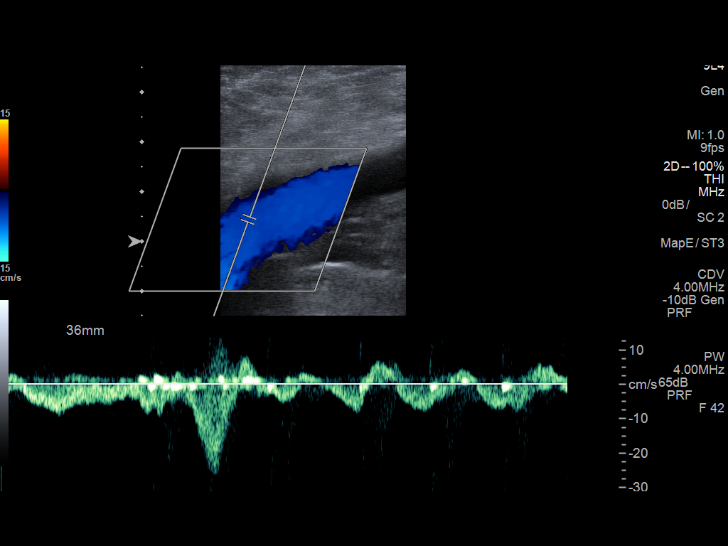
[im 8/45]
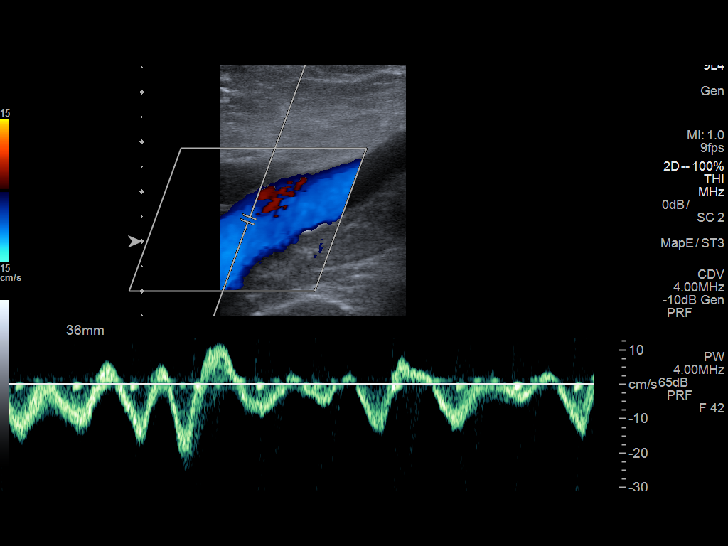
[im 12/45]
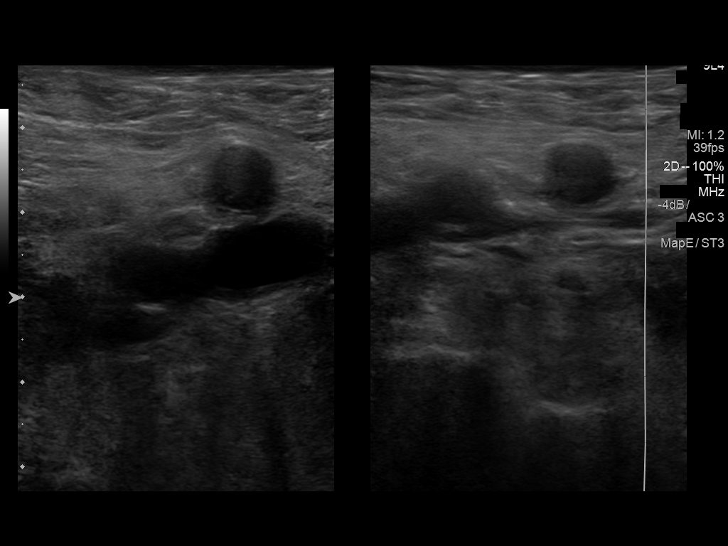
[im 16/45]
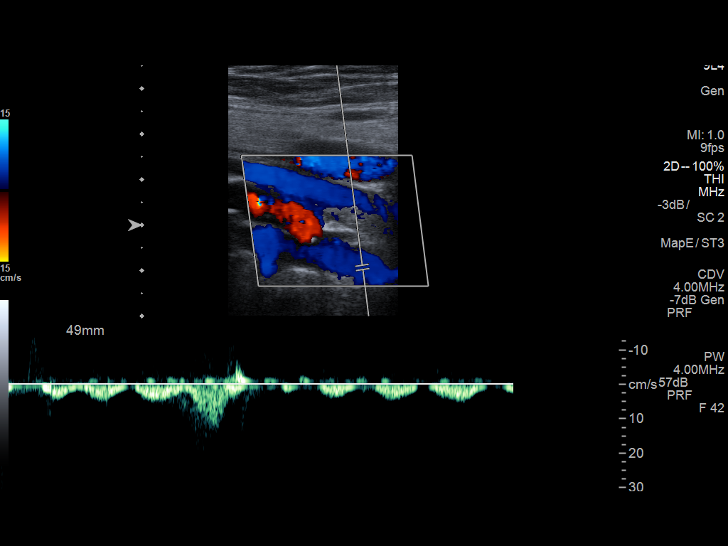
[im 20/45]
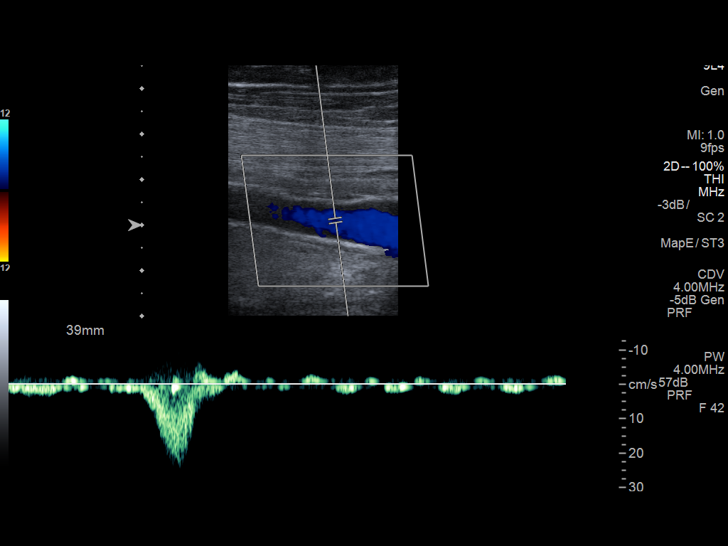
[im 25/45]
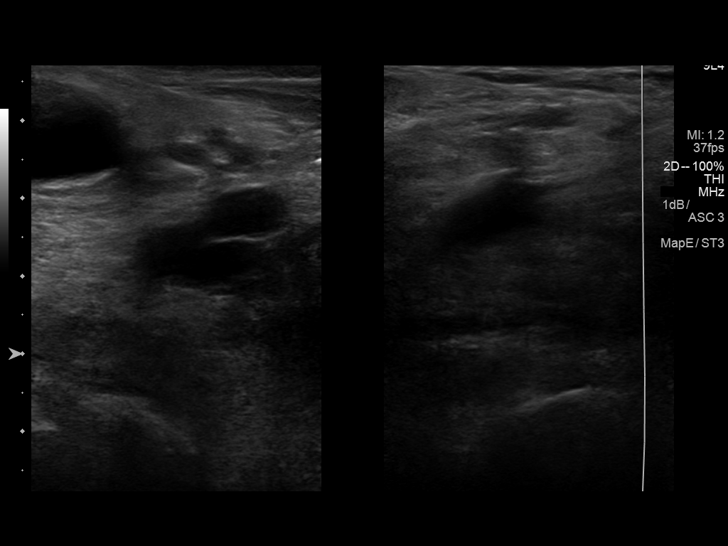
[im 27/45]
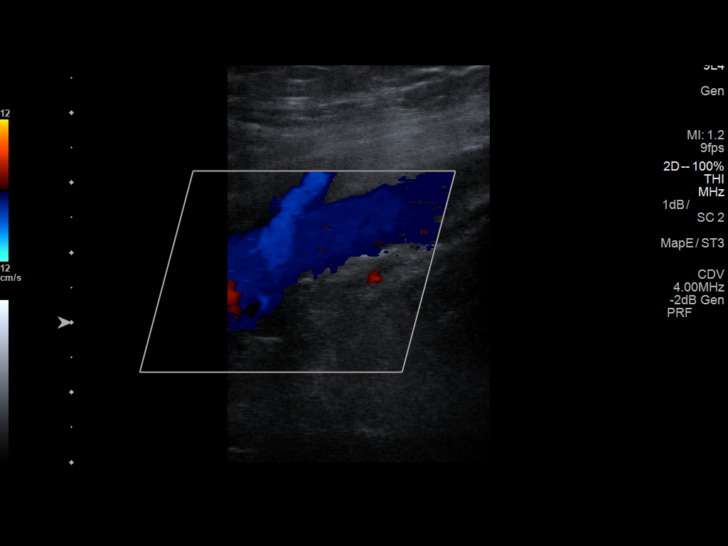
[im 31/45]
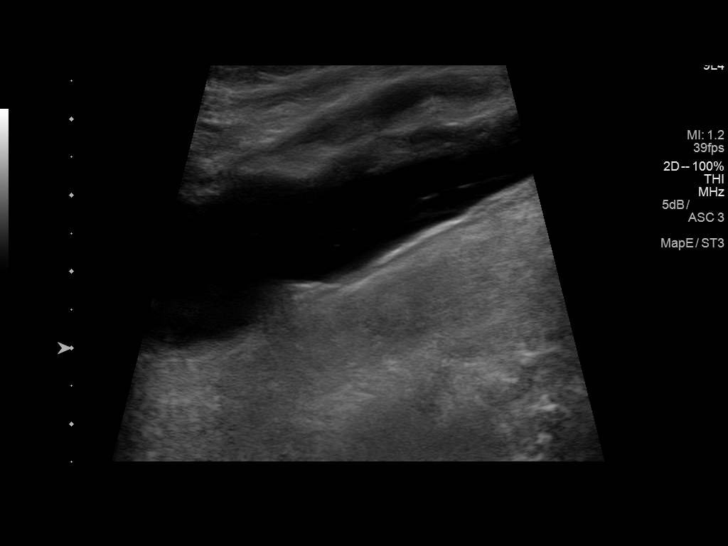
[im 35/45]
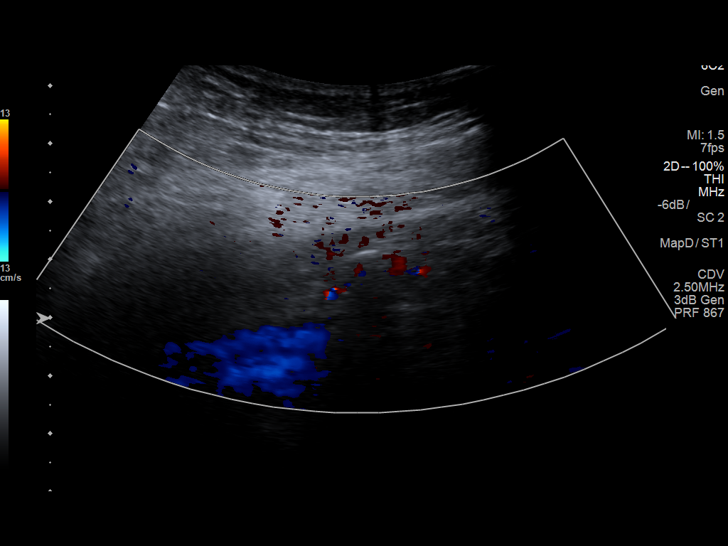
[im 39/45]
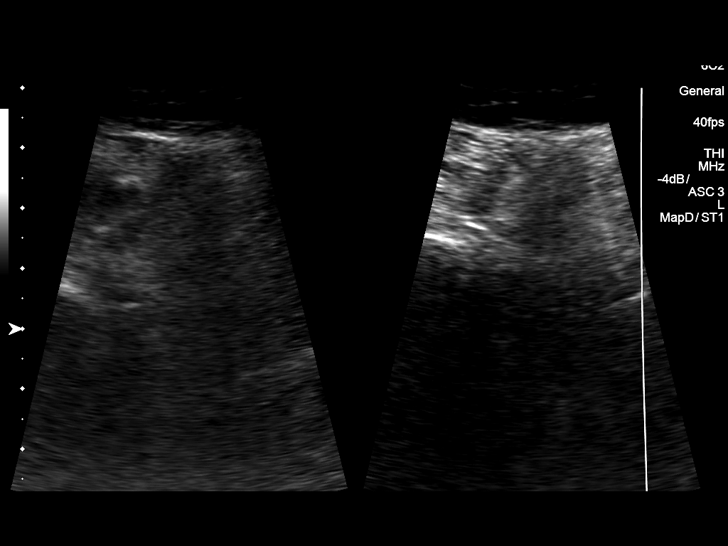
[im 43/45]
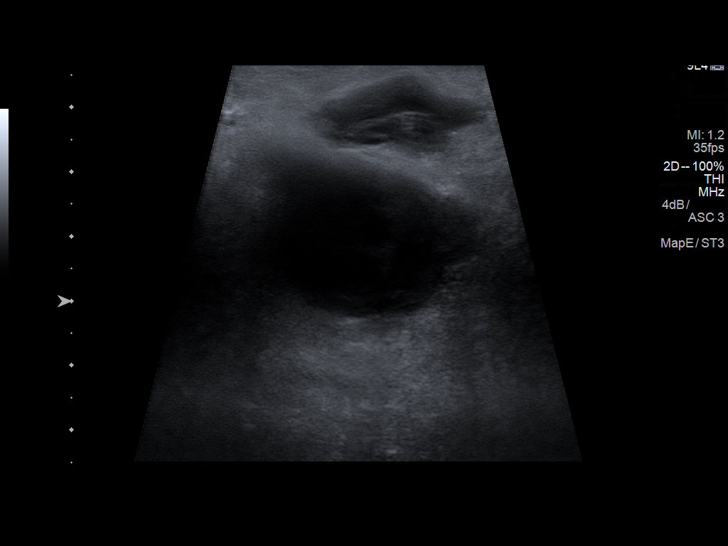
[im 45/45]
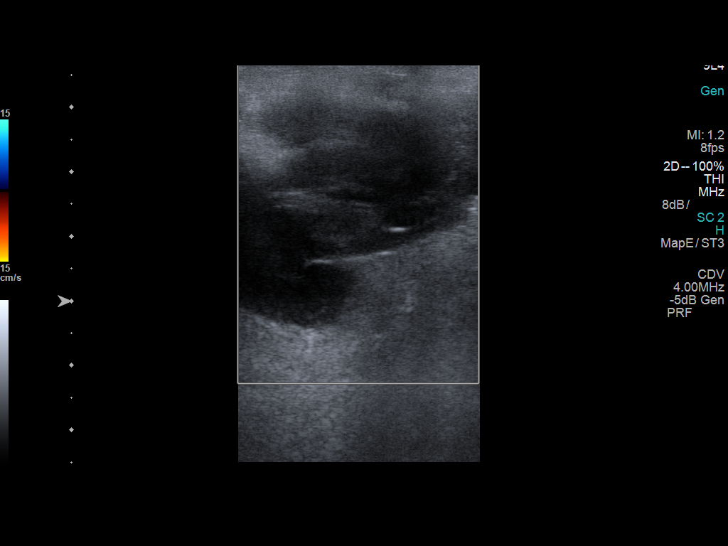

[13 of 24 positions shown; findings below may reference images not displayed]

FINDINGS: Contralateral Common Femoral Vein: Respiratory phasicity is normal
and symmetric with the symptomatic side. No evidence of thrombus.
Normal compressibility.

Common Femoral Vein: No evidence of thrombus. Normal
compressibility, respiratory phasicity and response to augmentation.

Saphenofemoral Junction: No evidence of thrombus. Normal
compressibility and flow on color Doppler imaging.

Profunda Femoral Vein: No evidence of thrombus. Normal
compressibility and flow on color Doppler imaging.

Femoral Vein: No evidence of thrombus. Normal compressibility,
respiratory phasicity and response to augmentation.

Popliteal Vein: No evidence of thrombus. Normal compressibility,
respiratory phasicity and response to augmentation.

Calf Veins: No evidence of thrombus. Normal compressibility and flow
on color Doppler imaging.

Superficial Great Saphenous Vein: No evidence of thrombus. Normal
compressibility and flow on color Doppler imaging.

Venous Reflux:  None.

Other Findings: There is an approximately 5.9 x 3.9 x 1.8 cm
anechoic slightly serpiginous fluid collection with the right
popliteal fossa favored to represent a Baker cyst.

There is a serpiginous mixed echogenic approximately 3.8 x 5.9 x
cm apparent fluid collection involving the right posterior thigh at
the patient's area of bruising.
IMPRESSION: 1. No evidence of DVT within right lower extremity.
2. Note made of an approximately 5.9 cm complex fluid collection
involving the right posterior thigh at the patient's area of
bruising. While nonspecific, given history of anti coagulation
therapy, this structure may represent a hematoma. Clinical
correlation is advised.
3. Incidental note made of an approximately 5.9 cm Baker's cyst.

## 2017-05-12 DIAGNOSIS — E7800 Pure hypercholesterolemia, unspecified: Secondary | ICD-10-CM | POA: Diagnosis not present

## 2017-05-12 DIAGNOSIS — I482 Chronic atrial fibrillation: Secondary | ICD-10-CM | POA: Diagnosis not present

## 2017-05-12 DIAGNOSIS — Z6838 Body mass index (BMI) 38.0-38.9, adult: Secondary | ICD-10-CM | POA: Diagnosis not present

## 2017-05-12 DIAGNOSIS — G309 Alzheimer's disease, unspecified: Secondary | ICD-10-CM | POA: Diagnosis not present

## 2017-05-12 DIAGNOSIS — Z299 Encounter for prophylactic measures, unspecified: Secondary | ICD-10-CM | POA: Diagnosis not present

## 2017-05-12 DIAGNOSIS — I2510 Atherosclerotic heart disease of native coronary artery without angina pectoris: Secondary | ICD-10-CM | POA: Diagnosis not present

## 2017-05-12 DIAGNOSIS — I10 Essential (primary) hypertension: Secondary | ICD-10-CM | POA: Diagnosis not present

## 2017-05-12 DIAGNOSIS — F0280 Dementia in other diseases classified elsewhere without behavioral disturbance: Secondary | ICD-10-CM | POA: Diagnosis not present

## 2017-05-12 DIAGNOSIS — R609 Edema, unspecified: Secondary | ICD-10-CM | POA: Diagnosis not present

## 2017-05-12 DIAGNOSIS — I951 Orthostatic hypotension: Secondary | ICD-10-CM | POA: Diagnosis not present

## 2017-05-25 DIAGNOSIS — F0280 Dementia in other diseases classified elsewhere without behavioral disturbance: Secondary | ICD-10-CM | POA: Diagnosis not present

## 2017-05-25 DIAGNOSIS — I482 Chronic atrial fibrillation: Secondary | ICD-10-CM | POA: Diagnosis not present

## 2017-05-25 DIAGNOSIS — E7800 Pure hypercholesterolemia, unspecified: Secondary | ICD-10-CM | POA: Diagnosis not present

## 2017-05-25 DIAGNOSIS — G309 Alzheimer's disease, unspecified: Secondary | ICD-10-CM | POA: Diagnosis not present

## 2017-05-25 DIAGNOSIS — I10 Essential (primary) hypertension: Secondary | ICD-10-CM | POA: Diagnosis not present

## 2017-05-25 DIAGNOSIS — I2510 Atherosclerotic heart disease of native coronary artery without angina pectoris: Secondary | ICD-10-CM | POA: Diagnosis not present

## 2017-05-25 DIAGNOSIS — Z6838 Body mass index (BMI) 38.0-38.9, adult: Secondary | ICD-10-CM | POA: Diagnosis not present

## 2017-05-25 DIAGNOSIS — Z299 Encounter for prophylactic measures, unspecified: Secondary | ICD-10-CM | POA: Diagnosis not present

## 2017-06-14 DIAGNOSIS — E7800 Pure hypercholesterolemia, unspecified: Secondary | ICD-10-CM | POA: Diagnosis not present

## 2017-06-14 DIAGNOSIS — F0280 Dementia in other diseases classified elsewhere without behavioral disturbance: Secondary | ICD-10-CM | POA: Diagnosis not present

## 2017-06-14 DIAGNOSIS — I10 Essential (primary) hypertension: Secondary | ICD-10-CM | POA: Diagnosis not present

## 2017-06-14 DIAGNOSIS — I482 Chronic atrial fibrillation: Secondary | ICD-10-CM | POA: Diagnosis not present

## 2017-06-14 DIAGNOSIS — Z299 Encounter for prophylactic measures, unspecified: Secondary | ICD-10-CM | POA: Diagnosis not present

## 2017-06-14 DIAGNOSIS — Z6837 Body mass index (BMI) 37.0-37.9, adult: Secondary | ICD-10-CM | POA: Diagnosis not present

## 2017-06-14 DIAGNOSIS — G309 Alzheimer's disease, unspecified: Secondary | ICD-10-CM | POA: Diagnosis not present

## 2017-06-27 IMAGING — NM NM MYOCAR MULTI W/SPECT W/WALL MOTION & EF
2 series · 12 of 12 positions shown · non-contrast
Comparison: none

[Series 1: rest · 8.28mm/px · 6 of 64 frames shown]
[frame 6/64]
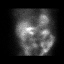
[frame 16/64]
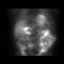
[frame 27/64]
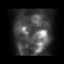
[frame 38/64]
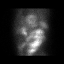
[frame 48/64]
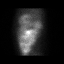
[frame 59/64]
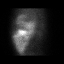

[Series 2: stress gated · 8.28mm/px · 6 of 64 frames shown]
[frame 6/64]
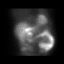
[frame 16/64]
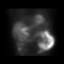
[frame 27/64]
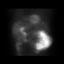
[frame 38/64]
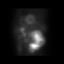
[frame 48/64]
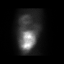
[frame 59/64]
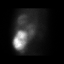

[12 of 12 positions shown; findings below may reference images not displayed]

Canned report from images found in remote index.

Refer to host system for actual result text.

## 2017-07-19 DIAGNOSIS — I10 Essential (primary) hypertension: Secondary | ICD-10-CM | POA: Diagnosis not present

## 2017-07-19 DIAGNOSIS — Z6837 Body mass index (BMI) 37.0-37.9, adult: Secondary | ICD-10-CM | POA: Diagnosis not present

## 2017-07-19 DIAGNOSIS — I482 Chronic atrial fibrillation: Secondary | ICD-10-CM | POA: Diagnosis not present

## 2017-07-19 DIAGNOSIS — E7800 Pure hypercholesterolemia, unspecified: Secondary | ICD-10-CM | POA: Diagnosis not present

## 2017-07-19 DIAGNOSIS — F0280 Dementia in other diseases classified elsewhere without behavioral disturbance: Secondary | ICD-10-CM | POA: Diagnosis not present

## 2017-07-19 DIAGNOSIS — Z299 Encounter for prophylactic measures, unspecified: Secondary | ICD-10-CM | POA: Diagnosis not present

## 2017-07-19 DIAGNOSIS — G309 Alzheimer's disease, unspecified: Secondary | ICD-10-CM | POA: Diagnosis not present

## 2017-08-02 DIAGNOSIS — R5383 Other fatigue: Secondary | ICD-10-CM | POA: Diagnosis not present

## 2017-08-02 DIAGNOSIS — Z7189 Other specified counseling: Secondary | ICD-10-CM | POA: Diagnosis not present

## 2017-08-02 DIAGNOSIS — Z6837 Body mass index (BMI) 37.0-37.9, adult: Secondary | ICD-10-CM | POA: Diagnosis not present

## 2017-08-02 DIAGNOSIS — Z299 Encounter for prophylactic measures, unspecified: Secondary | ICD-10-CM | POA: Diagnosis not present

## 2017-08-02 DIAGNOSIS — I10 Essential (primary) hypertension: Secondary | ICD-10-CM | POA: Diagnosis not present

## 2017-08-02 DIAGNOSIS — G309 Alzheimer's disease, unspecified: Secondary | ICD-10-CM | POA: Diagnosis not present

## 2017-08-02 DIAGNOSIS — E7800 Pure hypercholesterolemia, unspecified: Secondary | ICD-10-CM | POA: Diagnosis not present

## 2017-08-02 DIAGNOSIS — Z0000 Encounter for general adult medical examination without abnormal findings: Secondary | ICD-10-CM | POA: Diagnosis not present

## 2017-08-02 DIAGNOSIS — I482 Chronic atrial fibrillation: Secondary | ICD-10-CM | POA: Diagnosis not present

## 2017-08-02 DIAGNOSIS — I2510 Atherosclerotic heart disease of native coronary artery without angina pectoris: Secondary | ICD-10-CM | POA: Diagnosis not present

## 2017-08-02 DIAGNOSIS — Z125 Encounter for screening for malignant neoplasm of prostate: Secondary | ICD-10-CM | POA: Diagnosis not present

## 2017-08-02 DIAGNOSIS — Z1389 Encounter for screening for other disorder: Secondary | ICD-10-CM | POA: Diagnosis not present

## 2017-08-02 DIAGNOSIS — F0280 Dementia in other diseases classified elsewhere without behavioral disturbance: Secondary | ICD-10-CM | POA: Diagnosis not present

## 2017-08-02 DIAGNOSIS — Z79899 Other long term (current) drug therapy: Secondary | ICD-10-CM | POA: Diagnosis not present

## 2017-08-19 DIAGNOSIS — Z1211 Encounter for screening for malignant neoplasm of colon: Secondary | ICD-10-CM | POA: Diagnosis not present

## 2017-08-19 DIAGNOSIS — Z299 Encounter for prophylactic measures, unspecified: Secondary | ICD-10-CM | POA: Diagnosis not present

## 2017-08-19 DIAGNOSIS — I10 Essential (primary) hypertension: Secondary | ICD-10-CM | POA: Diagnosis not present

## 2017-08-19 DIAGNOSIS — E7800 Pure hypercholesterolemia, unspecified: Secondary | ICD-10-CM | POA: Diagnosis not present

## 2017-08-19 DIAGNOSIS — Z6837 Body mass index (BMI) 37.0-37.9, adult: Secondary | ICD-10-CM | POA: Diagnosis not present

## 2017-08-19 DIAGNOSIS — I482 Chronic atrial fibrillation: Secondary | ICD-10-CM | POA: Diagnosis not present

## 2017-09-01 DIAGNOSIS — Z299 Encounter for prophylactic measures, unspecified: Secondary | ICD-10-CM | POA: Diagnosis not present

## 2017-09-01 DIAGNOSIS — Z6835 Body mass index (BMI) 35.0-35.9, adult: Secondary | ICD-10-CM | POA: Diagnosis not present

## 2017-09-01 DIAGNOSIS — I482 Chronic atrial fibrillation: Secondary | ICD-10-CM | POA: Diagnosis not present

## 2017-09-01 DIAGNOSIS — F0280 Dementia in other diseases classified elsewhere without behavioral disturbance: Secondary | ICD-10-CM | POA: Diagnosis not present

## 2017-09-01 DIAGNOSIS — G309 Alzheimer's disease, unspecified: Secondary | ICD-10-CM | POA: Diagnosis not present

## 2017-09-01 DIAGNOSIS — R0602 Shortness of breath: Secondary | ICD-10-CM | POA: Diagnosis not present

## 2017-09-24 DIAGNOSIS — Z23 Encounter for immunization: Secondary | ICD-10-CM | POA: Diagnosis not present

## 2017-09-24 DIAGNOSIS — E7800 Pure hypercholesterolemia, unspecified: Secondary | ICD-10-CM | POA: Diagnosis not present

## 2017-09-24 DIAGNOSIS — G309 Alzheimer's disease, unspecified: Secondary | ICD-10-CM | POA: Diagnosis not present

## 2017-09-24 DIAGNOSIS — Z299 Encounter for prophylactic measures, unspecified: Secondary | ICD-10-CM | POA: Diagnosis not present

## 2017-09-24 DIAGNOSIS — I10 Essential (primary) hypertension: Secondary | ICD-10-CM | POA: Diagnosis not present

## 2017-09-24 DIAGNOSIS — F0280 Dementia in other diseases classified elsewhere without behavioral disturbance: Secondary | ICD-10-CM | POA: Diagnosis not present

## 2017-09-24 DIAGNOSIS — I482 Chronic atrial fibrillation: Secondary | ICD-10-CM | POA: Diagnosis not present

## 2017-09-24 DIAGNOSIS — Z6836 Body mass index (BMI) 36.0-36.9, adult: Secondary | ICD-10-CM | POA: Diagnosis not present

## 2017-09-24 DIAGNOSIS — Z713 Dietary counseling and surveillance: Secondary | ICD-10-CM | POA: Diagnosis not present

## 2017-10-29 DIAGNOSIS — Z299 Encounter for prophylactic measures, unspecified: Secondary | ICD-10-CM | POA: Diagnosis not present

## 2017-10-29 DIAGNOSIS — I482 Chronic atrial fibrillation: Secondary | ICD-10-CM | POA: Diagnosis not present

## 2017-10-29 DIAGNOSIS — Z713 Dietary counseling and surveillance: Secondary | ICD-10-CM | POA: Diagnosis not present

## 2017-10-29 DIAGNOSIS — Z6836 Body mass index (BMI) 36.0-36.9, adult: Secondary | ICD-10-CM | POA: Diagnosis not present

## 2017-11-26 DIAGNOSIS — I10 Essential (primary) hypertension: Secondary | ICD-10-CM | POA: Diagnosis not present

## 2017-11-26 DIAGNOSIS — I482 Chronic atrial fibrillation: Secondary | ICD-10-CM | POA: Diagnosis not present

## 2017-11-26 DIAGNOSIS — F0280 Dementia in other diseases classified elsewhere without behavioral disturbance: Secondary | ICD-10-CM | POA: Diagnosis not present

## 2017-11-26 DIAGNOSIS — Z299 Encounter for prophylactic measures, unspecified: Secondary | ICD-10-CM | POA: Diagnosis not present

## 2017-11-26 DIAGNOSIS — G309 Alzheimer's disease, unspecified: Secondary | ICD-10-CM | POA: Diagnosis not present

## 2017-11-26 DIAGNOSIS — E7800 Pure hypercholesterolemia, unspecified: Secondary | ICD-10-CM | POA: Diagnosis not present

## 2017-11-26 DIAGNOSIS — Z6836 Body mass index (BMI) 36.0-36.9, adult: Secondary | ICD-10-CM | POA: Diagnosis not present

## 2017-12-24 DIAGNOSIS — I482 Chronic atrial fibrillation: Secondary | ICD-10-CM | POA: Diagnosis not present

## 2017-12-24 DIAGNOSIS — Z299 Encounter for prophylactic measures, unspecified: Secondary | ICD-10-CM | POA: Diagnosis not present

## 2017-12-24 DIAGNOSIS — F0280 Dementia in other diseases classified elsewhere without behavioral disturbance: Secondary | ICD-10-CM | POA: Diagnosis not present

## 2017-12-24 DIAGNOSIS — Z789 Other specified health status: Secondary | ICD-10-CM | POA: Diagnosis not present

## 2017-12-24 DIAGNOSIS — Z6836 Body mass index (BMI) 36.0-36.9, adult: Secondary | ICD-10-CM | POA: Diagnosis not present

## 2017-12-24 DIAGNOSIS — G309 Alzheimer's disease, unspecified: Secondary | ICD-10-CM | POA: Diagnosis not present

## 2017-12-24 DIAGNOSIS — I10 Essential (primary) hypertension: Secondary | ICD-10-CM | POA: Diagnosis not present

## 2018-01-13 DIAGNOSIS — I10 Essential (primary) hypertension: Secondary | ICD-10-CM | POA: Diagnosis not present

## 2018-01-13 DIAGNOSIS — Z299 Encounter for prophylactic measures, unspecified: Secondary | ICD-10-CM | POA: Diagnosis not present

## 2018-01-13 DIAGNOSIS — Z713 Dietary counseling and surveillance: Secondary | ICD-10-CM | POA: Diagnosis not present

## 2018-01-13 DIAGNOSIS — I482 Chronic atrial fibrillation: Secondary | ICD-10-CM | POA: Diagnosis not present

## 2018-01-13 DIAGNOSIS — Z6836 Body mass index (BMI) 36.0-36.9, adult: Secondary | ICD-10-CM | POA: Diagnosis not present

## 2018-01-20 DIAGNOSIS — H524 Presbyopia: Secondary | ICD-10-CM | POA: Diagnosis not present

## 2018-02-07 DIAGNOSIS — B351 Tinea unguium: Secondary | ICD-10-CM | POA: Diagnosis not present

## 2018-02-07 DIAGNOSIS — M79675 Pain in left toe(s): Secondary | ICD-10-CM | POA: Diagnosis not present

## 2018-02-07 DIAGNOSIS — M79674 Pain in right toe(s): Secondary | ICD-10-CM | POA: Diagnosis not present

## 2018-02-21 DIAGNOSIS — Z789 Other specified health status: Secondary | ICD-10-CM | POA: Diagnosis not present

## 2018-02-21 DIAGNOSIS — I10 Essential (primary) hypertension: Secondary | ICD-10-CM | POA: Diagnosis not present

## 2018-02-21 DIAGNOSIS — Z6835 Body mass index (BMI) 35.0-35.9, adult: Secondary | ICD-10-CM | POA: Diagnosis not present

## 2018-02-21 DIAGNOSIS — Z299 Encounter for prophylactic measures, unspecified: Secondary | ICD-10-CM | POA: Diagnosis not present

## 2018-02-21 DIAGNOSIS — F0280 Dementia in other diseases classified elsewhere without behavioral disturbance: Secondary | ICD-10-CM | POA: Diagnosis not present

## 2018-02-21 DIAGNOSIS — I482 Chronic atrial fibrillation: Secondary | ICD-10-CM | POA: Diagnosis not present

## 2018-02-21 DIAGNOSIS — G309 Alzheimer's disease, unspecified: Secondary | ICD-10-CM | POA: Diagnosis not present

## 2018-03-21 DIAGNOSIS — I482 Chronic atrial fibrillation: Secondary | ICD-10-CM | POA: Diagnosis not present

## 2018-03-21 DIAGNOSIS — Z713 Dietary counseling and surveillance: Secondary | ICD-10-CM | POA: Diagnosis not present

## 2018-03-21 DIAGNOSIS — Z6835 Body mass index (BMI) 35.0-35.9, adult: Secondary | ICD-10-CM | POA: Diagnosis not present

## 2018-03-21 DIAGNOSIS — Z299 Encounter for prophylactic measures, unspecified: Secondary | ICD-10-CM | POA: Diagnosis not present

## 2018-04-18 DIAGNOSIS — M79675 Pain in left toe(s): Secondary | ICD-10-CM | POA: Diagnosis not present

## 2018-04-18 DIAGNOSIS — M79674 Pain in right toe(s): Secondary | ICD-10-CM | POA: Diagnosis not present

## 2018-04-18 DIAGNOSIS — B351 Tinea unguium: Secondary | ICD-10-CM | POA: Diagnosis not present

## 2018-04-21 DIAGNOSIS — I482 Chronic atrial fibrillation: Secondary | ICD-10-CM | POA: Diagnosis not present

## 2018-04-21 DIAGNOSIS — Z6835 Body mass index (BMI) 35.0-35.9, adult: Secondary | ICD-10-CM | POA: Diagnosis not present

## 2018-04-21 DIAGNOSIS — I10 Essential (primary) hypertension: Secondary | ICD-10-CM | POA: Diagnosis not present

## 2018-04-21 DIAGNOSIS — F0280 Dementia in other diseases classified elsewhere without behavioral disturbance: Secondary | ICD-10-CM | POA: Diagnosis not present

## 2018-04-21 DIAGNOSIS — G309 Alzheimer's disease, unspecified: Secondary | ICD-10-CM | POA: Diagnosis not present

## 2018-04-21 DIAGNOSIS — Z299 Encounter for prophylactic measures, unspecified: Secondary | ICD-10-CM | POA: Diagnosis not present

## 2018-05-20 DIAGNOSIS — I10 Essential (primary) hypertension: Secondary | ICD-10-CM | POA: Diagnosis not present

## 2018-05-20 DIAGNOSIS — G309 Alzheimer's disease, unspecified: Secondary | ICD-10-CM | POA: Diagnosis not present

## 2018-05-20 DIAGNOSIS — F0280 Dementia in other diseases classified elsewhere without behavioral disturbance: Secondary | ICD-10-CM | POA: Diagnosis not present

## 2018-05-20 DIAGNOSIS — Z6835 Body mass index (BMI) 35.0-35.9, adult: Secondary | ICD-10-CM | POA: Diagnosis not present

## 2018-05-20 DIAGNOSIS — I482 Chronic atrial fibrillation: Secondary | ICD-10-CM | POA: Diagnosis not present

## 2018-05-20 DIAGNOSIS — Z299 Encounter for prophylactic measures, unspecified: Secondary | ICD-10-CM | POA: Diagnosis not present

## 2018-06-21 DIAGNOSIS — I482 Chronic atrial fibrillation: Secondary | ICD-10-CM | POA: Diagnosis not present

## 2018-06-21 DIAGNOSIS — G309 Alzheimer's disease, unspecified: Secondary | ICD-10-CM | POA: Diagnosis not present

## 2018-06-21 DIAGNOSIS — Z299 Encounter for prophylactic measures, unspecified: Secondary | ICD-10-CM | POA: Diagnosis not present

## 2018-06-21 DIAGNOSIS — F0280 Dementia in other diseases classified elsewhere without behavioral disturbance: Secondary | ICD-10-CM | POA: Diagnosis not present

## 2018-06-21 DIAGNOSIS — Z6834 Body mass index (BMI) 34.0-34.9, adult: Secondary | ICD-10-CM | POA: Diagnosis not present

## 2018-06-21 DIAGNOSIS — I10 Essential (primary) hypertension: Secondary | ICD-10-CM | POA: Diagnosis not present

## 2018-06-27 DIAGNOSIS — M79674 Pain in right toe(s): Secondary | ICD-10-CM | POA: Diagnosis not present

## 2018-06-27 DIAGNOSIS — M79675 Pain in left toe(s): Secondary | ICD-10-CM | POA: Diagnosis not present

## 2018-06-27 DIAGNOSIS — B351 Tinea unguium: Secondary | ICD-10-CM | POA: Diagnosis not present

## 2018-07-22 DIAGNOSIS — F0280 Dementia in other diseases classified elsewhere without behavioral disturbance: Secondary | ICD-10-CM | POA: Diagnosis not present

## 2018-07-22 DIAGNOSIS — F0390 Unspecified dementia without behavioral disturbance: Secondary | ICD-10-CM | POA: Diagnosis not present

## 2018-07-22 DIAGNOSIS — I482 Chronic atrial fibrillation: Secondary | ICD-10-CM | POA: Diagnosis not present

## 2018-07-22 DIAGNOSIS — I10 Essential (primary) hypertension: Secondary | ICD-10-CM | POA: Diagnosis not present

## 2018-07-22 DIAGNOSIS — Z6834 Body mass index (BMI) 34.0-34.9, adult: Secondary | ICD-10-CM | POA: Diagnosis not present

## 2018-07-22 DIAGNOSIS — G309 Alzheimer's disease, unspecified: Secondary | ICD-10-CM | POA: Diagnosis not present

## 2018-07-22 DIAGNOSIS — Z299 Encounter for prophylactic measures, unspecified: Secondary | ICD-10-CM | POA: Diagnosis not present

## 2018-08-04 DIAGNOSIS — Z0000 Encounter for general adult medical examination without abnormal findings: Secondary | ICD-10-CM | POA: Diagnosis not present

## 2018-08-04 DIAGNOSIS — E7800 Pure hypercholesterolemia, unspecified: Secondary | ICD-10-CM | POA: Diagnosis not present

## 2018-08-04 DIAGNOSIS — I482 Chronic atrial fibrillation: Secondary | ICD-10-CM | POA: Diagnosis not present

## 2018-08-04 DIAGNOSIS — Z299 Encounter for prophylactic measures, unspecified: Secondary | ICD-10-CM | POA: Diagnosis not present

## 2018-08-04 DIAGNOSIS — I10 Essential (primary) hypertension: Secondary | ICD-10-CM | POA: Diagnosis not present

## 2018-08-04 DIAGNOSIS — Z6834 Body mass index (BMI) 34.0-34.9, adult: Secondary | ICD-10-CM | POA: Diagnosis not present

## 2018-08-04 DIAGNOSIS — Z1339 Encounter for screening examination for other mental health and behavioral disorders: Secondary | ICD-10-CM | POA: Diagnosis not present

## 2018-08-04 DIAGNOSIS — Z1211 Encounter for screening for malignant neoplasm of colon: Secondary | ICD-10-CM | POA: Diagnosis not present

## 2018-08-04 DIAGNOSIS — Z125 Encounter for screening for malignant neoplasm of prostate: Secondary | ICD-10-CM | POA: Diagnosis not present

## 2018-08-04 DIAGNOSIS — R5383 Other fatigue: Secondary | ICD-10-CM | POA: Diagnosis not present

## 2018-08-04 DIAGNOSIS — Z79899 Other long term (current) drug therapy: Secondary | ICD-10-CM | POA: Diagnosis not present

## 2018-08-04 DIAGNOSIS — Z1331 Encounter for screening for depression: Secondary | ICD-10-CM | POA: Diagnosis not present

## 2018-08-04 DIAGNOSIS — Z7189 Other specified counseling: Secondary | ICD-10-CM | POA: Diagnosis not present

## 2018-08-08 DIAGNOSIS — I480 Paroxysmal atrial fibrillation: Secondary | ICD-10-CM | POA: Diagnosis not present

## 2018-09-02 DIAGNOSIS — I482 Chronic atrial fibrillation: Secondary | ICD-10-CM | POA: Diagnosis not present

## 2018-09-02 DIAGNOSIS — F0390 Unspecified dementia without behavioral disturbance: Secondary | ICD-10-CM | POA: Diagnosis not present

## 2018-09-02 DIAGNOSIS — Z6834 Body mass index (BMI) 34.0-34.9, adult: Secondary | ICD-10-CM | POA: Diagnosis not present

## 2018-09-02 DIAGNOSIS — I10 Essential (primary) hypertension: Secondary | ICD-10-CM | POA: Diagnosis not present

## 2018-09-02 DIAGNOSIS — Z299 Encounter for prophylactic measures, unspecified: Secondary | ICD-10-CM | POA: Diagnosis not present

## 2018-09-05 DIAGNOSIS — M79675 Pain in left toe(s): Secondary | ICD-10-CM | POA: Diagnosis not present

## 2018-09-05 DIAGNOSIS — M79674 Pain in right toe(s): Secondary | ICD-10-CM | POA: Diagnosis not present

## 2018-09-05 DIAGNOSIS — B351 Tinea unguium: Secondary | ICD-10-CM | POA: Diagnosis not present

## 2018-10-04 DIAGNOSIS — Z6834 Body mass index (BMI) 34.0-34.9, adult: Secondary | ICD-10-CM | POA: Diagnosis not present

## 2018-10-04 DIAGNOSIS — I10 Essential (primary) hypertension: Secondary | ICD-10-CM | POA: Diagnosis not present

## 2018-10-04 DIAGNOSIS — Z299 Encounter for prophylactic measures, unspecified: Secondary | ICD-10-CM | POA: Diagnosis not present

## 2018-10-04 DIAGNOSIS — Z713 Dietary counseling and surveillance: Secondary | ICD-10-CM | POA: Diagnosis not present

## 2018-11-04 DIAGNOSIS — Z299 Encounter for prophylactic measures, unspecified: Secondary | ICD-10-CM | POA: Diagnosis not present

## 2018-11-04 DIAGNOSIS — Z6834 Body mass index (BMI) 34.0-34.9, adult: Secondary | ICD-10-CM | POA: Diagnosis not present

## 2018-11-04 DIAGNOSIS — I10 Essential (primary) hypertension: Secondary | ICD-10-CM | POA: Diagnosis not present

## 2018-11-04 DIAGNOSIS — I4891 Unspecified atrial fibrillation: Secondary | ICD-10-CM | POA: Diagnosis not present

## 2018-11-14 DIAGNOSIS — M79674 Pain in right toe(s): Secondary | ICD-10-CM | POA: Diagnosis not present

## 2018-11-14 DIAGNOSIS — M79675 Pain in left toe(s): Secondary | ICD-10-CM | POA: Diagnosis not present

## 2018-11-14 DIAGNOSIS — B351 Tinea unguium: Secondary | ICD-10-CM | POA: Diagnosis not present

## 2018-12-05 DIAGNOSIS — F0280 Dementia in other diseases classified elsewhere without behavioral disturbance: Secondary | ICD-10-CM | POA: Diagnosis not present

## 2018-12-05 DIAGNOSIS — Z299 Encounter for prophylactic measures, unspecified: Secondary | ICD-10-CM | POA: Diagnosis not present

## 2018-12-05 DIAGNOSIS — G309 Alzheimer's disease, unspecified: Secondary | ICD-10-CM | POA: Diagnosis not present

## 2018-12-05 DIAGNOSIS — I4821 Permanent atrial fibrillation: Secondary | ICD-10-CM | POA: Diagnosis not present

## 2018-12-05 DIAGNOSIS — I10 Essential (primary) hypertension: Secondary | ICD-10-CM | POA: Diagnosis not present

## 2018-12-05 DIAGNOSIS — Z6834 Body mass index (BMI) 34.0-34.9, adult: Secondary | ICD-10-CM | POA: Diagnosis not present

## 2019-01-23 DIAGNOSIS — I10 Essential (primary) hypertension: Secondary | ICD-10-CM | POA: Diagnosis not present

## 2019-01-23 DIAGNOSIS — M79675 Pain in left toe(s): Secondary | ICD-10-CM | POA: Diagnosis not present

## 2019-01-23 DIAGNOSIS — I4821 Permanent atrial fibrillation: Secondary | ICD-10-CM | POA: Diagnosis not present

## 2019-01-23 DIAGNOSIS — M79674 Pain in right toe(s): Secondary | ICD-10-CM | POA: Diagnosis not present

## 2019-01-23 DIAGNOSIS — B351 Tinea unguium: Secondary | ICD-10-CM | POA: Diagnosis not present

## 2019-01-23 DIAGNOSIS — F0280 Dementia in other diseases classified elsewhere without behavioral disturbance: Secondary | ICD-10-CM | POA: Diagnosis not present

## 2019-01-23 DIAGNOSIS — Z6834 Body mass index (BMI) 34.0-34.9, adult: Secondary | ICD-10-CM | POA: Diagnosis not present

## 2019-01-23 DIAGNOSIS — Z789 Other specified health status: Secondary | ICD-10-CM | POA: Diagnosis not present

## 2019-01-23 DIAGNOSIS — Z299 Encounter for prophylactic measures, unspecified: Secondary | ICD-10-CM | POA: Diagnosis not present

## 2019-01-23 DIAGNOSIS — G309 Alzheimer's disease, unspecified: Secondary | ICD-10-CM | POA: Diagnosis not present

## 2019-02-20 DIAGNOSIS — Z6834 Body mass index (BMI) 34.0-34.9, adult: Secondary | ICD-10-CM | POA: Diagnosis not present

## 2019-02-20 DIAGNOSIS — I10 Essential (primary) hypertension: Secondary | ICD-10-CM | POA: Diagnosis not present

## 2019-02-20 DIAGNOSIS — Z299 Encounter for prophylactic measures, unspecified: Secondary | ICD-10-CM | POA: Diagnosis not present

## 2019-02-20 DIAGNOSIS — I4821 Permanent atrial fibrillation: Secondary | ICD-10-CM | POA: Diagnosis not present

## 2019-03-23 DIAGNOSIS — Z6834 Body mass index (BMI) 34.0-34.9, adult: Secondary | ICD-10-CM | POA: Diagnosis not present

## 2019-03-23 DIAGNOSIS — I10 Essential (primary) hypertension: Secondary | ICD-10-CM | POA: Diagnosis not present

## 2019-03-23 DIAGNOSIS — F0390 Unspecified dementia without behavioral disturbance: Secondary | ICD-10-CM | POA: Diagnosis not present

## 2019-03-23 DIAGNOSIS — C8238 Follicular lymphoma grade IIIa, lymph nodes of multiple sites: Secondary | ICD-10-CM | POA: Diagnosis not present

## 2019-03-23 DIAGNOSIS — Z299 Encounter for prophylactic measures, unspecified: Secondary | ICD-10-CM | POA: Diagnosis not present

## 2019-03-23 DIAGNOSIS — I4821 Permanent atrial fibrillation: Secondary | ICD-10-CM | POA: Diagnosis not present

## 2019-03-23 DIAGNOSIS — R1110 Vomiting, unspecified: Secondary | ICD-10-CM | POA: Diagnosis not present

## 2019-03-23 DIAGNOSIS — J90 Pleural effusion, not elsewhere classified: Secondary | ICD-10-CM | POA: Diagnosis not present

## 2019-03-23 DIAGNOSIS — Z23 Encounter for immunization: Secondary | ICD-10-CM | POA: Diagnosis not present

## 2019-04-24 DIAGNOSIS — I10 Essential (primary) hypertension: Secondary | ICD-10-CM | POA: Diagnosis not present

## 2019-04-24 DIAGNOSIS — Z299 Encounter for prophylactic measures, unspecified: Secondary | ICD-10-CM | POA: Diagnosis not present

## 2019-04-24 DIAGNOSIS — B354 Tinea corporis: Secondary | ICD-10-CM | POA: Diagnosis not present

## 2019-04-24 DIAGNOSIS — I4821 Permanent atrial fibrillation: Secondary | ICD-10-CM | POA: Diagnosis not present

## 2019-04-24 DIAGNOSIS — Z6834 Body mass index (BMI) 34.0-34.9, adult: Secondary | ICD-10-CM | POA: Diagnosis not present

## 2019-05-01 DIAGNOSIS — M79675 Pain in left toe(s): Secondary | ICD-10-CM | POA: Diagnosis not present

## 2019-05-01 DIAGNOSIS — B351 Tinea unguium: Secondary | ICD-10-CM | POA: Diagnosis not present

## 2019-05-01 DIAGNOSIS — M79674 Pain in right toe(s): Secondary | ICD-10-CM | POA: Diagnosis not present

## 2019-05-19 DIAGNOSIS — H0100A Unspecified blepharitis right eye, upper and lower eyelids: Secondary | ICD-10-CM | POA: Diagnosis not present

## 2019-05-19 DIAGNOSIS — Z961 Presence of intraocular lens: Secondary | ICD-10-CM | POA: Diagnosis not present

## 2019-05-19 DIAGNOSIS — H47093 Other disorders of optic nerve, not elsewhere classified, bilateral: Secondary | ICD-10-CM | POA: Diagnosis not present

## 2019-05-19 DIAGNOSIS — Z9842 Cataract extraction status, left eye: Secondary | ICD-10-CM | POA: Diagnosis not present

## 2019-05-19 DIAGNOSIS — H26491 Other secondary cataract, right eye: Secondary | ICD-10-CM | POA: Diagnosis not present

## 2019-05-19 DIAGNOSIS — H353132 Nonexudative age-related macular degeneration, bilateral, intermediate dry stage: Secondary | ICD-10-CM | POA: Diagnosis not present

## 2019-05-19 DIAGNOSIS — H524 Presbyopia: Secondary | ICD-10-CM | POA: Diagnosis not present

## 2019-05-19 DIAGNOSIS — H5211 Myopia, right eye: Secondary | ICD-10-CM | POA: Diagnosis not present

## 2019-05-19 DIAGNOSIS — H52223 Regular astigmatism, bilateral: Secondary | ICD-10-CM | POA: Diagnosis not present

## 2019-05-19 DIAGNOSIS — H0100B Unspecified blepharitis left eye, upper and lower eyelids: Secondary | ICD-10-CM | POA: Diagnosis not present

## 2019-05-19 DIAGNOSIS — H35433 Paving stone degeneration of retina, bilateral: Secondary | ICD-10-CM | POA: Diagnosis not present

## 2019-05-19 DIAGNOSIS — H3540 Unspecified peripheral retinal degeneration: Secondary | ICD-10-CM | POA: Diagnosis not present

## 2019-05-25 DIAGNOSIS — Z299 Encounter for prophylactic measures, unspecified: Secondary | ICD-10-CM | POA: Diagnosis not present

## 2019-05-25 DIAGNOSIS — I10 Essential (primary) hypertension: Secondary | ICD-10-CM | POA: Diagnosis not present

## 2019-05-25 DIAGNOSIS — Z6834 Body mass index (BMI) 34.0-34.9, adult: Secondary | ICD-10-CM | POA: Diagnosis not present

## 2019-05-25 DIAGNOSIS — Z713 Dietary counseling and surveillance: Secondary | ICD-10-CM | POA: Diagnosis not present

## 2019-05-25 DIAGNOSIS — I4821 Permanent atrial fibrillation: Secondary | ICD-10-CM | POA: Diagnosis not present

## 2019-06-26 DIAGNOSIS — Z713 Dietary counseling and surveillance: Secondary | ICD-10-CM | POA: Diagnosis not present

## 2019-06-26 DIAGNOSIS — I4821 Permanent atrial fibrillation: Secondary | ICD-10-CM | POA: Diagnosis not present

## 2019-06-26 DIAGNOSIS — Z299 Encounter for prophylactic measures, unspecified: Secondary | ICD-10-CM | POA: Diagnosis not present

## 2019-06-26 DIAGNOSIS — Z6834 Body mass index (BMI) 34.0-34.9, adult: Secondary | ICD-10-CM | POA: Diagnosis not present

## 2019-06-26 DIAGNOSIS — I10 Essential (primary) hypertension: Secondary | ICD-10-CM | POA: Diagnosis not present

## 2019-07-10 DIAGNOSIS — M79674 Pain in right toe(s): Secondary | ICD-10-CM | POA: Diagnosis not present

## 2019-07-10 DIAGNOSIS — M79675 Pain in left toe(s): Secondary | ICD-10-CM | POA: Diagnosis not present

## 2019-07-10 DIAGNOSIS — B351 Tinea unguium: Secondary | ICD-10-CM | POA: Diagnosis not present

## 2019-07-24 DIAGNOSIS — Z299 Encounter for prophylactic measures, unspecified: Secondary | ICD-10-CM | POA: Diagnosis not present

## 2019-07-24 DIAGNOSIS — F0390 Unspecified dementia without behavioral disturbance: Secondary | ICD-10-CM | POA: Diagnosis not present

## 2019-07-24 DIAGNOSIS — I4891 Unspecified atrial fibrillation: Secondary | ICD-10-CM | POA: Diagnosis not present

## 2019-07-24 DIAGNOSIS — Z6834 Body mass index (BMI) 34.0-34.9, adult: Secondary | ICD-10-CM | POA: Diagnosis not present

## 2019-07-24 DIAGNOSIS — I10 Essential (primary) hypertension: Secondary | ICD-10-CM | POA: Diagnosis not present

## 2019-08-10 DIAGNOSIS — Z1339 Encounter for screening examination for other mental health and behavioral disorders: Secondary | ICD-10-CM | POA: Diagnosis not present

## 2019-08-10 DIAGNOSIS — Z125 Encounter for screening for malignant neoplasm of prostate: Secondary | ICD-10-CM | POA: Diagnosis not present

## 2019-08-10 DIAGNOSIS — R5383 Other fatigue: Secondary | ICD-10-CM | POA: Diagnosis not present

## 2019-08-10 DIAGNOSIS — M1990 Unspecified osteoarthritis, unspecified site: Secondary | ICD-10-CM | POA: Diagnosis not present

## 2019-08-10 DIAGNOSIS — E7800 Pure hypercholesterolemia, unspecified: Secondary | ICD-10-CM | POA: Diagnosis not present

## 2019-08-10 DIAGNOSIS — Z7189 Other specified counseling: Secondary | ICD-10-CM | POA: Diagnosis not present

## 2019-08-10 DIAGNOSIS — F0390 Unspecified dementia without behavioral disturbance: Secondary | ICD-10-CM | POA: Diagnosis not present

## 2019-08-10 DIAGNOSIS — Z1331 Encounter for screening for depression: Secondary | ICD-10-CM | POA: Diagnosis not present

## 2019-08-10 DIAGNOSIS — Z79899 Other long term (current) drug therapy: Secondary | ICD-10-CM | POA: Diagnosis not present

## 2019-08-10 DIAGNOSIS — Z1211 Encounter for screening for malignant neoplasm of colon: Secondary | ICD-10-CM | POA: Diagnosis not present

## 2019-08-10 DIAGNOSIS — Z0000 Encounter for general adult medical examination without abnormal findings: Secondary | ICD-10-CM | POA: Diagnosis not present

## 2019-08-10 DIAGNOSIS — Z299 Encounter for prophylactic measures, unspecified: Secondary | ICD-10-CM | POA: Diagnosis not present

## 2019-08-10 DIAGNOSIS — Z6833 Body mass index (BMI) 33.0-33.9, adult: Secondary | ICD-10-CM | POA: Diagnosis not present

## 2019-08-10 DIAGNOSIS — I10 Essential (primary) hypertension: Secondary | ICD-10-CM | POA: Diagnosis not present

## 2019-08-21 DIAGNOSIS — I10 Essential (primary) hypertension: Secondary | ICD-10-CM | POA: Diagnosis not present

## 2019-08-21 DIAGNOSIS — I4821 Permanent atrial fibrillation: Secondary | ICD-10-CM | POA: Diagnosis not present

## 2019-08-21 DIAGNOSIS — Z713 Dietary counseling and surveillance: Secondary | ICD-10-CM | POA: Diagnosis not present

## 2019-08-21 DIAGNOSIS — Z6833 Body mass index (BMI) 33.0-33.9, adult: Secondary | ICD-10-CM | POA: Diagnosis not present

## 2019-08-21 DIAGNOSIS — Z299 Encounter for prophylactic measures, unspecified: Secondary | ICD-10-CM | POA: Diagnosis not present

## 2019-09-18 DIAGNOSIS — Z299 Encounter for prophylactic measures, unspecified: Secondary | ICD-10-CM | POA: Diagnosis not present

## 2019-09-18 DIAGNOSIS — I2510 Atherosclerotic heart disease of native coronary artery without angina pectoris: Secondary | ICD-10-CM | POA: Diagnosis not present

## 2019-09-18 DIAGNOSIS — B351 Tinea unguium: Secondary | ICD-10-CM | POA: Diagnosis not present

## 2019-09-18 DIAGNOSIS — M79675 Pain in left toe(s): Secondary | ICD-10-CM | POA: Diagnosis not present

## 2019-09-18 DIAGNOSIS — I4821 Permanent atrial fibrillation: Secondary | ICD-10-CM | POA: Diagnosis not present

## 2019-09-18 DIAGNOSIS — M79674 Pain in right toe(s): Secondary | ICD-10-CM | POA: Diagnosis not present

## 2019-09-18 DIAGNOSIS — I10 Essential (primary) hypertension: Secondary | ICD-10-CM | POA: Diagnosis not present

## 2019-09-18 DIAGNOSIS — I739 Peripheral vascular disease, unspecified: Secondary | ICD-10-CM | POA: Diagnosis not present

## 2019-09-18 DIAGNOSIS — Z6833 Body mass index (BMI) 33.0-33.9, adult: Secondary | ICD-10-CM | POA: Diagnosis not present

## 2019-10-16 DIAGNOSIS — I10 Essential (primary) hypertension: Secondary | ICD-10-CM | POA: Diagnosis not present

## 2019-10-16 DIAGNOSIS — I4821 Permanent atrial fibrillation: Secondary | ICD-10-CM | POA: Diagnosis not present

## 2019-10-16 DIAGNOSIS — Z299 Encounter for prophylactic measures, unspecified: Secondary | ICD-10-CM | POA: Diagnosis not present

## 2019-10-16 DIAGNOSIS — Z6833 Body mass index (BMI) 33.0-33.9, adult: Secondary | ICD-10-CM | POA: Diagnosis not present

## 2019-10-16 DIAGNOSIS — R972 Elevated prostate specific antigen [PSA]: Secondary | ICD-10-CM | POA: Diagnosis not present

## 2019-11-06 DIAGNOSIS — I10 Essential (primary) hypertension: Secondary | ICD-10-CM | POA: Diagnosis not present

## 2019-11-06 DIAGNOSIS — Z6834 Body mass index (BMI) 34.0-34.9, adult: Secondary | ICD-10-CM | POA: Diagnosis not present

## 2019-11-06 DIAGNOSIS — F0280 Dementia in other diseases classified elsewhere without behavioral disturbance: Secondary | ICD-10-CM | POA: Diagnosis not present

## 2019-11-06 DIAGNOSIS — I4891 Unspecified atrial fibrillation: Secondary | ICD-10-CM | POA: Diagnosis not present

## 2019-11-06 DIAGNOSIS — S0083XA Contusion of other part of head, initial encounter: Secondary | ICD-10-CM | POA: Diagnosis not present

## 2019-11-06 DIAGNOSIS — Z299 Encounter for prophylactic measures, unspecified: Secondary | ICD-10-CM | POA: Diagnosis not present

## 2019-11-06 DIAGNOSIS — G309 Alzheimer's disease, unspecified: Secondary | ICD-10-CM | POA: Diagnosis not present

## 2019-11-06 DIAGNOSIS — R972 Elevated prostate specific antigen [PSA]: Secondary | ICD-10-CM | POA: Diagnosis not present

## 2019-11-20 DIAGNOSIS — G309 Alzheimer's disease, unspecified: Secondary | ICD-10-CM | POA: Diagnosis not present

## 2019-11-20 DIAGNOSIS — F0280 Dementia in other diseases classified elsewhere without behavioral disturbance: Secondary | ICD-10-CM | POA: Diagnosis not present

## 2019-11-20 DIAGNOSIS — Z299 Encounter for prophylactic measures, unspecified: Secondary | ICD-10-CM | POA: Diagnosis not present

## 2019-11-20 DIAGNOSIS — I10 Essential (primary) hypertension: Secondary | ICD-10-CM | POA: Diagnosis not present

## 2019-11-20 DIAGNOSIS — Z6833 Body mass index (BMI) 33.0-33.9, adult: Secondary | ICD-10-CM | POA: Diagnosis not present

## 2019-11-20 DIAGNOSIS — R972 Elevated prostate specific antigen [PSA]: Secondary | ICD-10-CM | POA: Diagnosis not present

## 2019-11-20 DIAGNOSIS — I4891 Unspecified atrial fibrillation: Secondary | ICD-10-CM | POA: Diagnosis not present

## 2019-11-27 DIAGNOSIS — M79675 Pain in left toe(s): Secondary | ICD-10-CM | POA: Diagnosis not present

## 2019-11-27 DIAGNOSIS — M79674 Pain in right toe(s): Secondary | ICD-10-CM | POA: Diagnosis not present

## 2019-11-27 DIAGNOSIS — I739 Peripheral vascular disease, unspecified: Secondary | ICD-10-CM | POA: Diagnosis not present

## 2019-11-27 DIAGNOSIS — B351 Tinea unguium: Secondary | ICD-10-CM | POA: Diagnosis not present

## 2019-12-20 DIAGNOSIS — Z299 Encounter for prophylactic measures, unspecified: Secondary | ICD-10-CM | POA: Diagnosis not present

## 2019-12-20 DIAGNOSIS — Z6832 Body mass index (BMI) 32.0-32.9, adult: Secondary | ICD-10-CM | POA: Diagnosis not present

## 2019-12-20 DIAGNOSIS — Z713 Dietary counseling and surveillance: Secondary | ICD-10-CM | POA: Diagnosis not present

## 2019-12-20 DIAGNOSIS — I10 Essential (primary) hypertension: Secondary | ICD-10-CM | POA: Diagnosis not present

## 2019-12-20 DIAGNOSIS — I4821 Permanent atrial fibrillation: Secondary | ICD-10-CM | POA: Diagnosis not present

## 2020-01-22 DIAGNOSIS — I10 Essential (primary) hypertension: Secondary | ICD-10-CM | POA: Diagnosis not present

## 2020-01-22 DIAGNOSIS — I4821 Permanent atrial fibrillation: Secondary | ICD-10-CM | POA: Diagnosis not present

## 2020-01-22 DIAGNOSIS — Z789 Other specified health status: Secondary | ICD-10-CM | POA: Diagnosis not present

## 2020-01-22 DIAGNOSIS — Z6832 Body mass index (BMI) 32.0-32.9, adult: Secondary | ICD-10-CM | POA: Diagnosis not present

## 2020-01-22 DIAGNOSIS — G4700 Insomnia, unspecified: Secondary | ICD-10-CM | POA: Diagnosis not present

## 2020-01-22 DIAGNOSIS — Z299 Encounter for prophylactic measures, unspecified: Secondary | ICD-10-CM | POA: Diagnosis not present

## 2020-02-05 DIAGNOSIS — M79674 Pain in right toe(s): Secondary | ICD-10-CM | POA: Diagnosis not present

## 2020-02-05 DIAGNOSIS — I739 Peripheral vascular disease, unspecified: Secondary | ICD-10-CM | POA: Diagnosis not present

## 2020-02-05 DIAGNOSIS — B351 Tinea unguium: Secondary | ICD-10-CM | POA: Diagnosis not present

## 2020-02-05 DIAGNOSIS — M79675 Pain in left toe(s): Secondary | ICD-10-CM | POA: Diagnosis not present

## 2020-02-18 DIAGNOSIS — E7800 Pure hypercholesterolemia, unspecified: Secondary | ICD-10-CM | POA: Diagnosis not present

## 2020-02-18 DIAGNOSIS — I10 Essential (primary) hypertension: Secondary | ICD-10-CM | POA: Diagnosis not present

## 2020-02-20 DIAGNOSIS — Z713 Dietary counseling and surveillance: Secondary | ICD-10-CM | POA: Diagnosis not present

## 2020-02-20 DIAGNOSIS — Z6832 Body mass index (BMI) 32.0-32.9, adult: Secondary | ICD-10-CM | POA: Diagnosis not present

## 2020-02-20 DIAGNOSIS — Z299 Encounter for prophylactic measures, unspecified: Secondary | ICD-10-CM | POA: Diagnosis not present

## 2020-02-20 DIAGNOSIS — I10 Essential (primary) hypertension: Secondary | ICD-10-CM | POA: Diagnosis not present

## 2020-02-20 DIAGNOSIS — I4821 Permanent atrial fibrillation: Secondary | ICD-10-CM | POA: Diagnosis not present

## 2020-03-06 DIAGNOSIS — I10 Essential (primary) hypertension: Secondary | ICD-10-CM | POA: Diagnosis not present

## 2020-03-06 DIAGNOSIS — Z6832 Body mass index (BMI) 32.0-32.9, adult: Secondary | ICD-10-CM | POA: Diagnosis not present

## 2020-03-06 DIAGNOSIS — Z299 Encounter for prophylactic measures, unspecified: Secondary | ICD-10-CM | POA: Diagnosis not present

## 2020-03-06 DIAGNOSIS — F0280 Dementia in other diseases classified elsewhere without behavioral disturbance: Secondary | ICD-10-CM | POA: Diagnosis not present

## 2020-03-06 DIAGNOSIS — G4700 Insomnia, unspecified: Secondary | ICD-10-CM | POA: Diagnosis not present

## 2020-03-06 DIAGNOSIS — G309 Alzheimer's disease, unspecified: Secondary | ICD-10-CM | POA: Diagnosis not present

## 2020-03-14 DIAGNOSIS — I10 Essential (primary) hypertension: Secondary | ICD-10-CM | POA: Diagnosis not present

## 2020-03-14 DIAGNOSIS — F0280 Dementia in other diseases classified elsewhere without behavioral disturbance: Secondary | ICD-10-CM | POA: Diagnosis not present

## 2020-03-14 DIAGNOSIS — I4891 Unspecified atrial fibrillation: Secondary | ICD-10-CM | POA: Diagnosis not present

## 2020-03-14 DIAGNOSIS — G309 Alzheimer's disease, unspecified: Secondary | ICD-10-CM | POA: Diagnosis not present

## 2020-03-14 DIAGNOSIS — Z299 Encounter for prophylactic measures, unspecified: Secondary | ICD-10-CM | POA: Diagnosis not present

## 2020-03-21 DIAGNOSIS — Z6831 Body mass index (BMI) 31.0-31.9, adult: Secondary | ICD-10-CM | POA: Diagnosis not present

## 2020-03-21 DIAGNOSIS — Z299 Encounter for prophylactic measures, unspecified: Secondary | ICD-10-CM | POA: Diagnosis not present

## 2020-03-21 DIAGNOSIS — Z789 Other specified health status: Secondary | ICD-10-CM | POA: Diagnosis not present

## 2020-03-21 DIAGNOSIS — I10 Essential (primary) hypertension: Secondary | ICD-10-CM | POA: Diagnosis not present

## 2020-03-21 DIAGNOSIS — I4891 Unspecified atrial fibrillation: Secondary | ICD-10-CM | POA: Diagnosis not present

## 2020-03-25 DIAGNOSIS — Z111 Encounter for screening for respiratory tuberculosis: Secondary | ICD-10-CM | POA: Diagnosis not present

## 2020-04-15 DIAGNOSIS — M79674 Pain in right toe(s): Secondary | ICD-10-CM | POA: Diagnosis not present

## 2020-04-15 DIAGNOSIS — I739 Peripheral vascular disease, unspecified: Secondary | ICD-10-CM | POA: Diagnosis not present

## 2020-04-15 DIAGNOSIS — B351 Tinea unguium: Secondary | ICD-10-CM | POA: Diagnosis not present

## 2020-04-15 DIAGNOSIS — M79675 Pain in left toe(s): Secondary | ICD-10-CM | POA: Diagnosis not present

## 2020-04-19 DIAGNOSIS — I10 Essential (primary) hypertension: Secondary | ICD-10-CM | POA: Diagnosis not present

## 2020-04-19 DIAGNOSIS — G4700 Insomnia, unspecified: Secondary | ICD-10-CM | POA: Diagnosis not present

## 2020-04-22 DIAGNOSIS — Z880 Allergy status to penicillin: Secondary | ICD-10-CM | POA: Diagnosis not present

## 2020-04-22 DIAGNOSIS — R2681 Unsteadiness on feet: Secondary | ICD-10-CM | POA: Diagnosis not present

## 2020-04-22 DIAGNOSIS — J811 Chronic pulmonary edema: Secondary | ICD-10-CM | POA: Diagnosis not present

## 2020-04-22 DIAGNOSIS — Z7901 Long term (current) use of anticoagulants: Secondary | ICD-10-CM | POA: Diagnosis not present

## 2020-04-22 DIAGNOSIS — R509 Fever, unspecified: Secondary | ICD-10-CM | POA: Diagnosis not present

## 2020-04-22 DIAGNOSIS — Z20822 Contact with and (suspected) exposure to covid-19: Secondary | ICD-10-CM | POA: Diagnosis not present

## 2020-04-22 DIAGNOSIS — Z6831 Body mass index (BMI) 31.0-31.9, adult: Secondary | ICD-10-CM | POA: Diagnosis not present

## 2020-04-22 DIAGNOSIS — Z79899 Other long term (current) drug therapy: Secondary | ICD-10-CM | POA: Diagnosis not present

## 2020-04-22 DIAGNOSIS — F0390 Unspecified dementia without behavioral disturbance: Secondary | ICD-10-CM | POA: Diagnosis not present

## 2020-04-22 DIAGNOSIS — I4821 Permanent atrial fibrillation: Secondary | ICD-10-CM | POA: Diagnosis not present

## 2020-04-22 DIAGNOSIS — Z299 Encounter for prophylactic measures, unspecified: Secondary | ICD-10-CM | POA: Diagnosis not present

## 2020-04-22 DIAGNOSIS — I10 Essential (primary) hypertension: Secondary | ICD-10-CM | POA: Diagnosis not present

## 2020-04-22 DIAGNOSIS — E860 Dehydration: Secondary | ICD-10-CM | POA: Diagnosis not present

## 2020-04-22 DIAGNOSIS — I4891 Unspecified atrial fibrillation: Secondary | ICD-10-CM | POA: Diagnosis not present

## 2020-04-23 DIAGNOSIS — Z299 Encounter for prophylactic measures, unspecified: Secondary | ICD-10-CM | POA: Diagnosis not present

## 2020-04-23 DIAGNOSIS — D72829 Elevated white blood cell count, unspecified: Secondary | ICD-10-CM | POA: Diagnosis not present

## 2020-04-23 DIAGNOSIS — I4821 Permanent atrial fibrillation: Secondary | ICD-10-CM | POA: Diagnosis not present

## 2020-04-23 DIAGNOSIS — I10 Essential (primary) hypertension: Secondary | ICD-10-CM | POA: Diagnosis not present

## 2020-04-26 DIAGNOSIS — U071 COVID-19: Secondary | ICD-10-CM | POA: Diagnosis not present

## 2020-04-26 DIAGNOSIS — Z20828 Contact with and (suspected) exposure to other viral communicable diseases: Secondary | ICD-10-CM | POA: Diagnosis not present

## 2020-04-30 DIAGNOSIS — J449 Chronic obstructive pulmonary disease, unspecified: Secondary | ICD-10-CM | POA: Diagnosis not present

## 2020-04-30 DIAGNOSIS — N183 Chronic kidney disease, stage 3 unspecified: Secondary | ICD-10-CM | POA: Diagnosis not present

## 2020-04-30 DIAGNOSIS — I10 Essential (primary) hypertension: Secondary | ICD-10-CM | POA: Diagnosis not present

## 2020-04-30 DIAGNOSIS — F329 Major depressive disorder, single episode, unspecified: Secondary | ICD-10-CM | POA: Diagnosis not present

## 2020-05-29 DIAGNOSIS — Z79899 Other long term (current) drug therapy: Secondary | ICD-10-CM | POA: Diagnosis not present

## 2020-05-29 DIAGNOSIS — E559 Vitamin D deficiency, unspecified: Secondary | ICD-10-CM | POA: Diagnosis not present

## 2020-05-29 DIAGNOSIS — D518 Other vitamin B12 deficiency anemias: Secondary | ICD-10-CM | POA: Diagnosis not present

## 2020-05-29 DIAGNOSIS — E119 Type 2 diabetes mellitus without complications: Secondary | ICD-10-CM | POA: Diagnosis not present

## 2020-05-29 DIAGNOSIS — E038 Other specified hypothyroidism: Secondary | ICD-10-CM | POA: Diagnosis not present

## 2020-05-29 DIAGNOSIS — E7849 Other hyperlipidemia: Secondary | ICD-10-CM | POA: Diagnosis not present

## 2020-06-07 DIAGNOSIS — N183 Chronic kidney disease, stage 3 unspecified: Secondary | ICD-10-CM | POA: Diagnosis not present

## 2020-06-07 DIAGNOSIS — F0390 Unspecified dementia without behavioral disturbance: Secondary | ICD-10-CM | POA: Diagnosis not present

## 2020-06-07 DIAGNOSIS — I10 Essential (primary) hypertension: Secondary | ICD-10-CM | POA: Diagnosis not present

## 2020-06-08 DIAGNOSIS — R079 Chest pain, unspecified: Secondary | ICD-10-CM | POA: Diagnosis not present

## 2020-06-18 DIAGNOSIS — U071 COVID-19: Secondary | ICD-10-CM | POA: Diagnosis not present

## 2020-06-18 DIAGNOSIS — Z79899 Other long term (current) drug therapy: Secondary | ICD-10-CM | POA: Diagnosis not present

## 2020-06-18 DIAGNOSIS — Z20828 Contact with and (suspected) exposure to other viral communicable diseases: Secondary | ICD-10-CM | POA: Diagnosis not present

## 2020-06-19 DIAGNOSIS — I10 Essential (primary) hypertension: Secondary | ICD-10-CM | POA: Diagnosis not present

## 2020-06-19 DIAGNOSIS — E7800 Pure hypercholesterolemia, unspecified: Secondary | ICD-10-CM | POA: Diagnosis not present

## 2020-07-05 DIAGNOSIS — F0390 Unspecified dementia without behavioral disturbance: Secondary | ICD-10-CM | POA: Diagnosis not present

## 2020-07-05 DIAGNOSIS — N183 Chronic kidney disease, stage 3 unspecified: Secondary | ICD-10-CM | POA: Diagnosis not present

## 2020-07-05 DIAGNOSIS — R600 Localized edema: Secondary | ICD-10-CM | POA: Diagnosis not present

## 2020-07-05 DIAGNOSIS — I10 Essential (primary) hypertension: Secondary | ICD-10-CM | POA: Diagnosis not present

## 2020-07-08 DIAGNOSIS — Z7901 Long term (current) use of anticoagulants: Secondary | ICD-10-CM | POA: Diagnosis not present

## 2020-07-10 DIAGNOSIS — E042 Nontoxic multinodular goiter: Secondary | ICD-10-CM | POA: Diagnosis not present

## 2020-07-10 DIAGNOSIS — I714 Abdominal aortic aneurysm, without rupture: Secondary | ICD-10-CM | POA: Diagnosis not present

## 2020-07-10 DIAGNOSIS — R221 Localized swelling, mass and lump, neck: Secondary | ICD-10-CM | POA: Diagnosis not present

## 2020-07-10 DIAGNOSIS — I700 Atherosclerosis of aorta: Secondary | ICD-10-CM | POA: Diagnosis not present

## 2020-07-12 DIAGNOSIS — R011 Cardiac murmur, unspecified: Secondary | ICD-10-CM | POA: Diagnosis not present

## 2020-07-16 DIAGNOSIS — R229 Localized swelling, mass and lump, unspecified: Secondary | ICD-10-CM | POA: Diagnosis not present

## 2020-07-16 DIAGNOSIS — R590 Localized enlarged lymph nodes: Secondary | ICD-10-CM | POA: Diagnosis not present

## 2020-07-16 DIAGNOSIS — I6523 Occlusion and stenosis of bilateral carotid arteries: Secondary | ICD-10-CM | POA: Diagnosis not present

## 2020-07-16 DIAGNOSIS — D492 Neoplasm of unspecified behavior of bone, soft tissue, and skin: Secondary | ICD-10-CM | POA: Diagnosis not present

## 2020-07-16 DIAGNOSIS — R0989 Other specified symptoms and signs involving the circulatory and respiratory systems: Secondary | ICD-10-CM | POA: Diagnosis not present

## 2020-07-22 DIAGNOSIS — Z7901 Long term (current) use of anticoagulants: Secondary | ICD-10-CM | POA: Diagnosis not present

## 2020-08-02 DIAGNOSIS — L219 Seborrheic dermatitis, unspecified: Secondary | ICD-10-CM | POA: Diagnosis not present

## 2020-08-02 DIAGNOSIS — I4891 Unspecified atrial fibrillation: Secondary | ICD-10-CM | POA: Diagnosis not present

## 2020-08-02 DIAGNOSIS — N183 Chronic kidney disease, stage 3 unspecified: Secondary | ICD-10-CM | POA: Diagnosis not present

## 2020-08-02 DIAGNOSIS — F0390 Unspecified dementia without behavioral disturbance: Secondary | ICD-10-CM | POA: Diagnosis not present

## 2020-08-02 DIAGNOSIS — I10 Essential (primary) hypertension: Secondary | ICD-10-CM | POA: Diagnosis not present

## 2020-08-06 DIAGNOSIS — Z79899 Other long term (current) drug therapy: Secondary | ICD-10-CM | POA: Diagnosis not present

## 2020-08-09 DIAGNOSIS — Z7901 Long term (current) use of anticoagulants: Secondary | ICD-10-CM | POA: Diagnosis not present

## 2020-08-12 DIAGNOSIS — E7849 Other hyperlipidemia: Secondary | ICD-10-CM | POA: Diagnosis not present

## 2020-08-12 DIAGNOSIS — E119 Type 2 diabetes mellitus without complications: Secondary | ICD-10-CM | POA: Diagnosis not present

## 2020-08-12 DIAGNOSIS — D518 Other vitamin B12 deficiency anemias: Secondary | ICD-10-CM | POA: Diagnosis not present

## 2020-08-12 DIAGNOSIS — Z79899 Other long term (current) drug therapy: Secondary | ICD-10-CM | POA: Diagnosis not present

## 2020-08-15 DIAGNOSIS — Z7901 Long term (current) use of anticoagulants: Secondary | ICD-10-CM | POA: Diagnosis not present

## 2020-08-20 DIAGNOSIS — I10 Essential (primary) hypertension: Secondary | ICD-10-CM | POA: Diagnosis not present

## 2020-08-20 DIAGNOSIS — I4821 Permanent atrial fibrillation: Secondary | ICD-10-CM | POA: Diagnosis not present

## 2020-08-20 DIAGNOSIS — E7849 Other hyperlipidemia: Secondary | ICD-10-CM | POA: Diagnosis not present

## 2020-08-20 DIAGNOSIS — Z7901 Long term (current) use of anticoagulants: Secondary | ICD-10-CM | POA: Diagnosis not present

## 2020-08-28 DIAGNOSIS — Z7901 Long term (current) use of anticoagulants: Secondary | ICD-10-CM | POA: Diagnosis not present

## 2020-09-04 DIAGNOSIS — E559 Vitamin D deficiency, unspecified: Secondary | ICD-10-CM | POA: Diagnosis not present

## 2020-09-04 DIAGNOSIS — D518 Other vitamin B12 deficiency anemias: Secondary | ICD-10-CM | POA: Diagnosis not present

## 2020-09-04 DIAGNOSIS — Z79899 Other long term (current) drug therapy: Secondary | ICD-10-CM | POA: Diagnosis not present

## 2020-09-04 DIAGNOSIS — Z7901 Long term (current) use of anticoagulants: Secondary | ICD-10-CM | POA: Diagnosis not present

## 2020-09-04 DIAGNOSIS — E7849 Other hyperlipidemia: Secondary | ICD-10-CM | POA: Diagnosis not present

## 2020-09-04 DIAGNOSIS — E038 Other specified hypothyroidism: Secondary | ICD-10-CM | POA: Diagnosis not present

## 2020-09-04 DIAGNOSIS — E119 Type 2 diabetes mellitus without complications: Secondary | ICD-10-CM | POA: Diagnosis not present

## 2020-09-12 DIAGNOSIS — Z7901 Long term (current) use of anticoagulants: Secondary | ICD-10-CM | POA: Diagnosis not present

## 2020-09-17 DIAGNOSIS — Z79899 Other long term (current) drug therapy: Secondary | ICD-10-CM | POA: Diagnosis not present

## 2020-09-17 DIAGNOSIS — E871 Hypo-osmolality and hyponatremia: Secondary | ICD-10-CM | POA: Diagnosis not present

## 2020-09-17 DIAGNOSIS — Z7901 Long term (current) use of anticoagulants: Secondary | ICD-10-CM | POA: Diagnosis not present

## 2020-09-19 DIAGNOSIS — E559 Vitamin D deficiency, unspecified: Secondary | ICD-10-CM | POA: Diagnosis not present

## 2020-09-19 DIAGNOSIS — I10 Essential (primary) hypertension: Secondary | ICD-10-CM | POA: Diagnosis not present

## 2020-09-19 DIAGNOSIS — F329 Major depressive disorder, single episode, unspecified: Secondary | ICD-10-CM | POA: Diagnosis not present

## 2020-09-19 DIAGNOSIS — E7800 Pure hypercholesterolemia, unspecified: Secondary | ICD-10-CM | POA: Diagnosis not present

## 2020-09-19 DIAGNOSIS — J449 Chronic obstructive pulmonary disease, unspecified: Secondary | ICD-10-CM | POA: Diagnosis not present

## 2020-09-19 DIAGNOSIS — E119 Type 2 diabetes mellitus without complications: Secondary | ICD-10-CM | POA: Diagnosis not present

## 2020-09-19 DIAGNOSIS — I4891 Unspecified atrial fibrillation: Secondary | ICD-10-CM | POA: Diagnosis not present

## 2020-09-19 DIAGNOSIS — D518 Other vitamin B12 deficiency anemias: Secondary | ICD-10-CM | POA: Diagnosis not present

## 2020-09-19 DIAGNOSIS — F0390 Unspecified dementia without behavioral disturbance: Secondary | ICD-10-CM | POA: Diagnosis not present

## 2020-09-19 DIAGNOSIS — E038 Other specified hypothyroidism: Secondary | ICD-10-CM | POA: Diagnosis not present

## 2020-09-24 DIAGNOSIS — Z7901 Long term (current) use of anticoagulants: Secondary | ICD-10-CM | POA: Diagnosis not present

## 2020-09-27 DIAGNOSIS — I10 Essential (primary) hypertension: Secondary | ICD-10-CM | POA: Diagnosis not present

## 2020-09-27 DIAGNOSIS — N183 Chronic kidney disease, stage 3 unspecified: Secondary | ICD-10-CM | POA: Diagnosis not present

## 2020-09-27 DIAGNOSIS — N400 Enlarged prostate without lower urinary tract symptoms: Secondary | ICD-10-CM | POA: Diagnosis not present

## 2020-09-27 DIAGNOSIS — J449 Chronic obstructive pulmonary disease, unspecified: Secondary | ICD-10-CM | POA: Diagnosis not present

## 2020-09-30 DIAGNOSIS — Z7901 Long term (current) use of anticoagulants: Secondary | ICD-10-CM | POA: Diagnosis not present

## 2020-10-08 DIAGNOSIS — N400 Enlarged prostate without lower urinary tract symptoms: Secondary | ICD-10-CM | POA: Diagnosis not present

## 2020-10-08 DIAGNOSIS — F329 Major depressive disorder, single episode, unspecified: Secondary | ICD-10-CM | POA: Diagnosis not present

## 2020-10-08 DIAGNOSIS — Z7901 Long term (current) use of anticoagulants: Secondary | ICD-10-CM | POA: Diagnosis not present

## 2020-10-08 DIAGNOSIS — F0390 Unspecified dementia without behavioral disturbance: Secondary | ICD-10-CM | POA: Diagnosis not present

## 2020-10-08 DIAGNOSIS — E038 Other specified hypothyroidism: Secondary | ICD-10-CM | POA: Diagnosis not present

## 2020-10-08 DIAGNOSIS — D518 Other vitamin B12 deficiency anemias: Secondary | ICD-10-CM | POA: Diagnosis not present

## 2020-10-08 DIAGNOSIS — I4891 Unspecified atrial fibrillation: Secondary | ICD-10-CM | POA: Diagnosis not present

## 2020-10-08 DIAGNOSIS — E119 Type 2 diabetes mellitus without complications: Secondary | ICD-10-CM | POA: Diagnosis not present

## 2020-10-08 DIAGNOSIS — E559 Vitamin D deficiency, unspecified: Secondary | ICD-10-CM | POA: Diagnosis not present

## 2020-10-08 DIAGNOSIS — I10 Essential (primary) hypertension: Secondary | ICD-10-CM | POA: Diagnosis not present

## 2020-10-08 DIAGNOSIS — J449 Chronic obstructive pulmonary disease, unspecified: Secondary | ICD-10-CM | POA: Diagnosis not present

## 2020-10-15 DIAGNOSIS — Z7901 Long term (current) use of anticoagulants: Secondary | ICD-10-CM | POA: Diagnosis not present

## 2020-10-22 DIAGNOSIS — Z7901 Long term (current) use of anticoagulants: Secondary | ICD-10-CM | POA: Diagnosis not present

## 2020-10-29 DIAGNOSIS — J449 Chronic obstructive pulmonary disease, unspecified: Secondary | ICD-10-CM | POA: Diagnosis not present

## 2020-10-29 DIAGNOSIS — N400 Enlarged prostate without lower urinary tract symptoms: Secondary | ICD-10-CM | POA: Diagnosis not present

## 2020-10-29 DIAGNOSIS — Z7901 Long term (current) use of anticoagulants: Secondary | ICD-10-CM | POA: Diagnosis not present

## 2020-10-29 DIAGNOSIS — I10 Essential (primary) hypertension: Secondary | ICD-10-CM | POA: Diagnosis not present

## 2020-10-29 DIAGNOSIS — N183 Chronic kidney disease, stage 3 unspecified: Secondary | ICD-10-CM | POA: Diagnosis not present

## 2020-10-29 DIAGNOSIS — I4891 Unspecified atrial fibrillation: Secondary | ICD-10-CM | POA: Diagnosis not present

## 2020-11-06 DIAGNOSIS — N400 Enlarged prostate without lower urinary tract symptoms: Secondary | ICD-10-CM | POA: Diagnosis not present

## 2020-11-06 DIAGNOSIS — E119 Type 2 diabetes mellitus without complications: Secondary | ICD-10-CM | POA: Diagnosis not present

## 2020-11-06 DIAGNOSIS — D518 Other vitamin B12 deficiency anemias: Secondary | ICD-10-CM | POA: Diagnosis not present

## 2020-11-06 DIAGNOSIS — I4891 Unspecified atrial fibrillation: Secondary | ICD-10-CM | POA: Diagnosis not present

## 2020-11-06 DIAGNOSIS — E038 Other specified hypothyroidism: Secondary | ICD-10-CM | POA: Diagnosis not present

## 2020-11-06 DIAGNOSIS — F329 Major depressive disorder, single episode, unspecified: Secondary | ICD-10-CM | POA: Diagnosis not present

## 2020-11-06 DIAGNOSIS — F0390 Unspecified dementia without behavioral disturbance: Secondary | ICD-10-CM | POA: Diagnosis not present

## 2020-11-06 DIAGNOSIS — I10 Essential (primary) hypertension: Secondary | ICD-10-CM | POA: Diagnosis not present

## 2020-11-06 DIAGNOSIS — J449 Chronic obstructive pulmonary disease, unspecified: Secondary | ICD-10-CM | POA: Diagnosis not present

## 2020-11-06 DIAGNOSIS — E559 Vitamin D deficiency, unspecified: Secondary | ICD-10-CM | POA: Diagnosis not present

## 2020-11-07 DIAGNOSIS — Z7901 Long term (current) use of anticoagulants: Secondary | ICD-10-CM | POA: Diagnosis not present

## 2020-11-12 DIAGNOSIS — Z7901 Long term (current) use of anticoagulants: Secondary | ICD-10-CM | POA: Diagnosis not present

## 2020-11-21 DIAGNOSIS — Z7901 Long term (current) use of anticoagulants: Secondary | ICD-10-CM | POA: Diagnosis not present

## 2020-11-26 DIAGNOSIS — N400 Enlarged prostate without lower urinary tract symptoms: Secondary | ICD-10-CM | POA: Diagnosis not present

## 2020-11-26 DIAGNOSIS — F0390 Unspecified dementia without behavioral disturbance: Secondary | ICD-10-CM | POA: Diagnosis not present

## 2020-11-26 DIAGNOSIS — J449 Chronic obstructive pulmonary disease, unspecified: Secondary | ICD-10-CM | POA: Diagnosis not present

## 2020-11-26 DIAGNOSIS — N183 Chronic kidney disease, stage 3 unspecified: Secondary | ICD-10-CM | POA: Diagnosis not present

## 2020-11-26 DIAGNOSIS — I10 Essential (primary) hypertension: Secondary | ICD-10-CM | POA: Diagnosis not present

## 2020-11-26 DIAGNOSIS — I4891 Unspecified atrial fibrillation: Secondary | ICD-10-CM | POA: Diagnosis not present

## 2020-11-26 DIAGNOSIS — R600 Localized edema: Secondary | ICD-10-CM | POA: Diagnosis not present

## 2020-12-04 DIAGNOSIS — E559 Vitamin D deficiency, unspecified: Secondary | ICD-10-CM | POA: Diagnosis not present

## 2020-12-04 DIAGNOSIS — Z7901 Long term (current) use of anticoagulants: Secondary | ICD-10-CM | POA: Diagnosis not present

## 2020-12-04 DIAGNOSIS — E119 Type 2 diabetes mellitus without complications: Secondary | ICD-10-CM | POA: Diagnosis not present

## 2020-12-04 DIAGNOSIS — E7849 Other hyperlipidemia: Secondary | ICD-10-CM | POA: Diagnosis not present

## 2020-12-04 DIAGNOSIS — D518 Other vitamin B12 deficiency anemias: Secondary | ICD-10-CM | POA: Diagnosis not present

## 2020-12-04 DIAGNOSIS — E038 Other specified hypothyroidism: Secondary | ICD-10-CM | POA: Diagnosis not present

## 2020-12-04 DIAGNOSIS — Z79899 Other long term (current) drug therapy: Secondary | ICD-10-CM | POA: Diagnosis not present

## 2020-12-05 DIAGNOSIS — F0390 Unspecified dementia without behavioral disturbance: Secondary | ICD-10-CM | POA: Diagnosis not present

## 2020-12-05 DIAGNOSIS — I4891 Unspecified atrial fibrillation: Secondary | ICD-10-CM | POA: Diagnosis not present

## 2020-12-05 DIAGNOSIS — J449 Chronic obstructive pulmonary disease, unspecified: Secondary | ICD-10-CM | POA: Diagnosis not present

## 2020-12-05 DIAGNOSIS — F329 Major depressive disorder, single episode, unspecified: Secondary | ICD-10-CM | POA: Diagnosis not present

## 2020-12-05 DIAGNOSIS — E119 Type 2 diabetes mellitus without complications: Secondary | ICD-10-CM | POA: Diagnosis not present

## 2020-12-05 DIAGNOSIS — D518 Other vitamin B12 deficiency anemias: Secondary | ICD-10-CM | POA: Diagnosis not present

## 2020-12-05 DIAGNOSIS — E038 Other specified hypothyroidism: Secondary | ICD-10-CM | POA: Diagnosis not present

## 2020-12-05 DIAGNOSIS — I10 Essential (primary) hypertension: Secondary | ICD-10-CM | POA: Diagnosis not present

## 2020-12-05 DIAGNOSIS — E559 Vitamin D deficiency, unspecified: Secondary | ICD-10-CM | POA: Diagnosis not present

## 2020-12-05 DIAGNOSIS — N400 Enlarged prostate without lower urinary tract symptoms: Secondary | ICD-10-CM | POA: Diagnosis not present

## 2020-12-11 DIAGNOSIS — Z20828 Contact with and (suspected) exposure to other viral communicable diseases: Secondary | ICD-10-CM | POA: Diagnosis not present

## 2020-12-11 DIAGNOSIS — U071 COVID-19: Secondary | ICD-10-CM | POA: Diagnosis not present

## 2020-12-18 DIAGNOSIS — Z7901 Long term (current) use of anticoagulants: Secondary | ICD-10-CM | POA: Diagnosis not present

## 2020-12-26 DIAGNOSIS — Z7901 Long term (current) use of anticoagulants: Secondary | ICD-10-CM | POA: Diagnosis not present

## 2020-12-31 DIAGNOSIS — F0390 Unspecified dementia without behavioral disturbance: Secondary | ICD-10-CM | POA: Diagnosis not present

## 2020-12-31 DIAGNOSIS — J449 Chronic obstructive pulmonary disease, unspecified: Secondary | ICD-10-CM | POA: Diagnosis not present

## 2020-12-31 DIAGNOSIS — I4891 Unspecified atrial fibrillation: Secondary | ICD-10-CM | POA: Diagnosis not present

## 2020-12-31 DIAGNOSIS — I10 Essential (primary) hypertension: Secondary | ICD-10-CM | POA: Diagnosis not present

## 2020-12-31 DIAGNOSIS — N183 Chronic kidney disease, stage 3 unspecified: Secondary | ICD-10-CM | POA: Diagnosis not present

## 2020-12-31 DIAGNOSIS — R600 Localized edema: Secondary | ICD-10-CM | POA: Diagnosis not present

## 2021-01-02 DIAGNOSIS — Z7901 Long term (current) use of anticoagulants: Secondary | ICD-10-CM | POA: Diagnosis not present

## 2021-01-05 DIAGNOSIS — F0390 Unspecified dementia without behavioral disturbance: Secondary | ICD-10-CM | POA: Diagnosis not present

## 2021-01-05 DIAGNOSIS — F329 Major depressive disorder, single episode, unspecified: Secondary | ICD-10-CM | POA: Diagnosis not present

## 2021-01-05 DIAGNOSIS — D518 Other vitamin B12 deficiency anemias: Secondary | ICD-10-CM | POA: Diagnosis not present

## 2021-01-05 DIAGNOSIS — N400 Enlarged prostate without lower urinary tract symptoms: Secondary | ICD-10-CM | POA: Diagnosis not present

## 2021-01-05 DIAGNOSIS — E559 Vitamin D deficiency, unspecified: Secondary | ICD-10-CM | POA: Diagnosis not present

## 2021-01-05 DIAGNOSIS — J449 Chronic obstructive pulmonary disease, unspecified: Secondary | ICD-10-CM | POA: Diagnosis not present

## 2021-01-05 DIAGNOSIS — I4891 Unspecified atrial fibrillation: Secondary | ICD-10-CM | POA: Diagnosis not present

## 2021-01-05 DIAGNOSIS — E119 Type 2 diabetes mellitus without complications: Secondary | ICD-10-CM | POA: Diagnosis not present

## 2021-01-05 DIAGNOSIS — I10 Essential (primary) hypertension: Secondary | ICD-10-CM | POA: Diagnosis not present

## 2021-01-05 DIAGNOSIS — E038 Other specified hypothyroidism: Secondary | ICD-10-CM | POA: Diagnosis not present

## 2021-01-09 DIAGNOSIS — Z20828 Contact with and (suspected) exposure to other viral communicable diseases: Secondary | ICD-10-CM | POA: Diagnosis not present

## 2021-01-09 DIAGNOSIS — U071 COVID-19: Secondary | ICD-10-CM | POA: Diagnosis not present

## 2021-01-09 DIAGNOSIS — Z7901 Long term (current) use of anticoagulants: Secondary | ICD-10-CM | POA: Diagnosis not present

## 2021-01-10 DIAGNOSIS — U071 COVID-19: Secondary | ICD-10-CM | POA: Diagnosis not present

## 2021-01-10 DIAGNOSIS — Z20828 Contact with and (suspected) exposure to other viral communicable diseases: Secondary | ICD-10-CM | POA: Diagnosis not present

## 2021-01-13 DIAGNOSIS — Z7901 Long term (current) use of anticoagulants: Secondary | ICD-10-CM | POA: Diagnosis not present

## 2021-01-16 DIAGNOSIS — Z7901 Long term (current) use of anticoagulants: Secondary | ICD-10-CM | POA: Diagnosis not present

## 2021-01-22 DIAGNOSIS — Z7901 Long term (current) use of anticoagulants: Secondary | ICD-10-CM | POA: Diagnosis not present

## 2021-01-27 DIAGNOSIS — Z79899 Other long term (current) drug therapy: Secondary | ICD-10-CM | POA: Diagnosis not present

## 2021-01-27 DIAGNOSIS — R791 Abnormal coagulation profile: Secondary | ICD-10-CM | POA: Diagnosis not present

## 2021-01-28 DIAGNOSIS — R600 Localized edema: Secondary | ICD-10-CM | POA: Diagnosis not present

## 2021-01-28 DIAGNOSIS — I10 Essential (primary) hypertension: Secondary | ICD-10-CM | POA: Diagnosis not present

## 2021-01-28 DIAGNOSIS — I4891 Unspecified atrial fibrillation: Secondary | ICD-10-CM | POA: Diagnosis not present

## 2021-01-28 DIAGNOSIS — F0390 Unspecified dementia without behavioral disturbance: Secondary | ICD-10-CM | POA: Diagnosis not present

## 2021-01-28 DIAGNOSIS — N183 Chronic kidney disease, stage 3 unspecified: Secondary | ICD-10-CM | POA: Diagnosis not present

## 2021-01-28 DIAGNOSIS — J449 Chronic obstructive pulmonary disease, unspecified: Secondary | ICD-10-CM | POA: Diagnosis not present

## 2021-02-03 DIAGNOSIS — Z7901 Long term (current) use of anticoagulants: Secondary | ICD-10-CM | POA: Diagnosis not present

## 2021-02-07 DIAGNOSIS — E038 Other specified hypothyroidism: Secondary | ICD-10-CM | POA: Diagnosis not present

## 2021-02-07 DIAGNOSIS — F329 Major depressive disorder, single episode, unspecified: Secondary | ICD-10-CM | POA: Diagnosis not present

## 2021-02-07 DIAGNOSIS — E559 Vitamin D deficiency, unspecified: Secondary | ICD-10-CM | POA: Diagnosis not present

## 2021-02-07 DIAGNOSIS — F0390 Unspecified dementia without behavioral disturbance: Secondary | ICD-10-CM | POA: Diagnosis not present

## 2021-02-07 DIAGNOSIS — E119 Type 2 diabetes mellitus without complications: Secondary | ICD-10-CM | POA: Diagnosis not present

## 2021-02-07 DIAGNOSIS — J449 Chronic obstructive pulmonary disease, unspecified: Secondary | ICD-10-CM | POA: Diagnosis not present

## 2021-02-07 DIAGNOSIS — N400 Enlarged prostate without lower urinary tract symptoms: Secondary | ICD-10-CM | POA: Diagnosis not present

## 2021-02-07 DIAGNOSIS — I4891 Unspecified atrial fibrillation: Secondary | ICD-10-CM | POA: Diagnosis not present

## 2021-02-07 DIAGNOSIS — D518 Other vitamin B12 deficiency anemias: Secondary | ICD-10-CM | POA: Diagnosis not present

## 2021-02-07 DIAGNOSIS — I10 Essential (primary) hypertension: Secondary | ICD-10-CM | POA: Diagnosis not present

## 2021-02-18 DIAGNOSIS — Z7901 Long term (current) use of anticoagulants: Secondary | ICD-10-CM | POA: Diagnosis not present

## 2021-02-25 DIAGNOSIS — I4891 Unspecified atrial fibrillation: Secondary | ICD-10-CM | POA: Diagnosis not present

## 2021-02-25 DIAGNOSIS — Z79899 Other long term (current) drug therapy: Secondary | ICD-10-CM | POA: Diagnosis not present

## 2021-03-04 DIAGNOSIS — Z7901 Long term (current) use of anticoagulants: Secondary | ICD-10-CM | POA: Diagnosis not present

## 2021-03-05 DIAGNOSIS — N183 Chronic kidney disease, stage 3 unspecified: Secondary | ICD-10-CM | POA: Diagnosis not present

## 2021-03-05 DIAGNOSIS — I4891 Unspecified atrial fibrillation: Secondary | ICD-10-CM | POA: Diagnosis not present

## 2021-03-05 DIAGNOSIS — I951 Orthostatic hypotension: Secondary | ICD-10-CM | POA: Diagnosis not present

## 2021-03-05 DIAGNOSIS — J449 Chronic obstructive pulmonary disease, unspecified: Secondary | ICD-10-CM | POA: Diagnosis not present

## 2021-03-05 DIAGNOSIS — I10 Essential (primary) hypertension: Secondary | ICD-10-CM | POA: Diagnosis not present

## 2021-03-05 DIAGNOSIS — F329 Major depressive disorder, single episode, unspecified: Secondary | ICD-10-CM | POA: Diagnosis not present

## 2021-03-05 DIAGNOSIS — N400 Enlarged prostate without lower urinary tract symptoms: Secondary | ICD-10-CM | POA: Diagnosis not present

## 2021-03-05 DIAGNOSIS — R0600 Dyspnea, unspecified: Secondary | ICD-10-CM | POA: Diagnosis not present

## 2021-03-11 DIAGNOSIS — Z7901 Long term (current) use of anticoagulants: Secondary | ICD-10-CM | POA: Diagnosis not present

## 2021-03-13 DIAGNOSIS — I4891 Unspecified atrial fibrillation: Secondary | ICD-10-CM | POA: Diagnosis not present

## 2021-03-13 DIAGNOSIS — N400 Enlarged prostate without lower urinary tract symptoms: Secondary | ICD-10-CM | POA: Diagnosis not present

## 2021-03-13 DIAGNOSIS — E119 Type 2 diabetes mellitus without complications: Secondary | ICD-10-CM | POA: Diagnosis not present

## 2021-03-13 DIAGNOSIS — J449 Chronic obstructive pulmonary disease, unspecified: Secondary | ICD-10-CM | POA: Diagnosis not present

## 2021-03-13 DIAGNOSIS — E038 Other specified hypothyroidism: Secondary | ICD-10-CM | POA: Diagnosis not present

## 2021-03-13 DIAGNOSIS — F0390 Unspecified dementia without behavioral disturbance: Secondary | ICD-10-CM | POA: Diagnosis not present

## 2021-03-13 DIAGNOSIS — E559 Vitamin D deficiency, unspecified: Secondary | ICD-10-CM | POA: Diagnosis not present

## 2021-03-13 DIAGNOSIS — F329 Major depressive disorder, single episode, unspecified: Secondary | ICD-10-CM | POA: Diagnosis not present

## 2021-03-13 DIAGNOSIS — D518 Other vitamin B12 deficiency anemias: Secondary | ICD-10-CM | POA: Diagnosis not present

## 2021-03-13 DIAGNOSIS — I10 Essential (primary) hypertension: Secondary | ICD-10-CM | POA: Diagnosis not present

## 2021-04-08 DIAGNOSIS — N183 Chronic kidney disease, stage 3 unspecified: Secondary | ICD-10-CM | POA: Diagnosis not present

## 2021-04-08 DIAGNOSIS — R790 Abnormal level of blood mineral: Secondary | ICD-10-CM | POA: Diagnosis not present

## 2021-04-08 DIAGNOSIS — R7989 Other specified abnormal findings of blood chemistry: Secondary | ICD-10-CM | POA: Diagnosis not present

## 2021-04-08 DIAGNOSIS — Z20822 Contact with and (suspected) exposure to covid-19: Secondary | ICD-10-CM | POA: Diagnosis not present

## 2021-04-08 DIAGNOSIS — J449 Chronic obstructive pulmonary disease, unspecified: Secondary | ICD-10-CM | POA: Diagnosis not present

## 2021-04-08 DIAGNOSIS — I4891 Unspecified atrial fibrillation: Secondary | ICD-10-CM | POA: Diagnosis not present

## 2021-04-08 DIAGNOSIS — Z7901 Long term (current) use of anticoagulants: Secondary | ICD-10-CM | POA: Diagnosis not present

## 2021-04-08 DIAGNOSIS — R7889 Finding of other specified substances, not normally found in blood: Secondary | ICD-10-CM | POA: Diagnosis not present

## 2021-04-09 DIAGNOSIS — I4891 Unspecified atrial fibrillation: Secondary | ICD-10-CM | POA: Diagnosis not present

## 2021-04-09 DIAGNOSIS — J449 Chronic obstructive pulmonary disease, unspecified: Secondary | ICD-10-CM | POA: Diagnosis not present

## 2021-04-09 DIAGNOSIS — N400 Enlarged prostate without lower urinary tract symptoms: Secondary | ICD-10-CM | POA: Diagnosis not present

## 2021-04-09 DIAGNOSIS — R791 Abnormal coagulation profile: Secondary | ICD-10-CM | POA: Diagnosis not present

## 2021-04-09 DIAGNOSIS — I10 Essential (primary) hypertension: Secondary | ICD-10-CM | POA: Diagnosis not present

## 2021-04-09 DIAGNOSIS — F329 Major depressive disorder, single episode, unspecified: Secondary | ICD-10-CM | POA: Diagnosis not present

## 2021-04-09 DIAGNOSIS — F0390 Unspecified dementia without behavioral disturbance: Secondary | ICD-10-CM | POA: Diagnosis not present

## 2021-04-12 DIAGNOSIS — R296 Repeated falls: Secondary | ICD-10-CM | POA: Diagnosis not present

## 2021-04-15 DIAGNOSIS — N183 Chronic kidney disease, stage 3 unspecified: Secondary | ICD-10-CM | POA: Diagnosis not present

## 2021-04-15 DIAGNOSIS — N400 Enlarged prostate without lower urinary tract symptoms: Secondary | ICD-10-CM | POA: Diagnosis not present

## 2021-04-15 DIAGNOSIS — E559 Vitamin D deficiency, unspecified: Secondary | ICD-10-CM | POA: Diagnosis not present

## 2021-04-15 DIAGNOSIS — I4891 Unspecified atrial fibrillation: Secondary | ICD-10-CM | POA: Diagnosis not present

## 2021-04-15 DIAGNOSIS — J449 Chronic obstructive pulmonary disease, unspecified: Secondary | ICD-10-CM | POA: Diagnosis not present

## 2021-04-15 DIAGNOSIS — F329 Major depressive disorder, single episode, unspecified: Secondary | ICD-10-CM | POA: Diagnosis not present

## 2021-04-15 DIAGNOSIS — E038 Other specified hypothyroidism: Secondary | ICD-10-CM | POA: Diagnosis not present

## 2021-04-15 DIAGNOSIS — F0390 Unspecified dementia without behavioral disturbance: Secondary | ICD-10-CM | POA: Diagnosis not present

## 2021-04-15 DIAGNOSIS — E119 Type 2 diabetes mellitus without complications: Secondary | ICD-10-CM | POA: Diagnosis not present

## 2021-04-15 DIAGNOSIS — D518 Other vitamin B12 deficiency anemias: Secondary | ICD-10-CM | POA: Diagnosis not present

## 2021-04-15 DIAGNOSIS — I10 Essential (primary) hypertension: Secondary | ICD-10-CM | POA: Diagnosis not present

## 2021-04-16 DIAGNOSIS — E261 Secondary hyperaldosteronism: Secondary | ICD-10-CM | POA: Diagnosis not present

## 2021-04-16 DIAGNOSIS — F0280 Dementia in other diseases classified elsewhere without behavioral disturbance: Secondary | ICD-10-CM | POA: Diagnosis not present

## 2021-04-16 DIAGNOSIS — D692 Other nonthrombocytopenic purpura: Secondary | ICD-10-CM | POA: Diagnosis not present

## 2021-04-16 DIAGNOSIS — I509 Heart failure, unspecified: Secondary | ICD-10-CM | POA: Diagnosis not present

## 2021-04-16 DIAGNOSIS — I739 Peripheral vascular disease, unspecified: Secondary | ICD-10-CM | POA: Diagnosis not present

## 2021-04-16 DIAGNOSIS — L02811 Cutaneous abscess of head [any part, except face]: Secondary | ICD-10-CM | POA: Diagnosis not present

## 2021-04-16 DIAGNOSIS — G309 Alzheimer's disease, unspecified: Secondary | ICD-10-CM | POA: Diagnosis not present

## 2021-04-18 DIAGNOSIS — R262 Difficulty in walking, not elsewhere classified: Secondary | ICD-10-CM | POA: Diagnosis not present

## 2021-04-18 DIAGNOSIS — G308 Other Alzheimer's disease: Secondary | ICD-10-CM | POA: Diagnosis not present

## 2021-04-18 DIAGNOSIS — R2681 Unsteadiness on feet: Secondary | ICD-10-CM | POA: Diagnosis not present

## 2021-04-18 DIAGNOSIS — M6281 Muscle weakness (generalized): Secondary | ICD-10-CM | POA: Diagnosis not present

## 2021-04-18 DIAGNOSIS — I951 Orthostatic hypotension: Secondary | ICD-10-CM | POA: Diagnosis not present

## 2021-04-18 DIAGNOSIS — F338 Other recurrent depressive disorders: Secondary | ICD-10-CM | POA: Diagnosis not present

## 2021-04-21 DIAGNOSIS — I951 Orthostatic hypotension: Secondary | ICD-10-CM | POA: Diagnosis not present

## 2021-04-21 DIAGNOSIS — G308 Other Alzheimer's disease: Secondary | ICD-10-CM | POA: Diagnosis not present

## 2021-04-21 DIAGNOSIS — R2681 Unsteadiness on feet: Secondary | ICD-10-CM | POA: Diagnosis not present

## 2021-04-21 DIAGNOSIS — F338 Other recurrent depressive disorders: Secondary | ICD-10-CM | POA: Diagnosis not present

## 2021-04-21 DIAGNOSIS — R262 Difficulty in walking, not elsewhere classified: Secondary | ICD-10-CM | POA: Diagnosis not present

## 2021-04-21 DIAGNOSIS — M6281 Muscle weakness (generalized): Secondary | ICD-10-CM | POA: Diagnosis not present

## 2021-04-23 DIAGNOSIS — R2681 Unsteadiness on feet: Secondary | ICD-10-CM | POA: Diagnosis not present

## 2021-04-23 DIAGNOSIS — F338 Other recurrent depressive disorders: Secondary | ICD-10-CM | POA: Diagnosis not present

## 2021-04-23 DIAGNOSIS — I951 Orthostatic hypotension: Secondary | ICD-10-CM | POA: Diagnosis not present

## 2021-04-23 DIAGNOSIS — G308 Other Alzheimer's disease: Secondary | ICD-10-CM | POA: Diagnosis not present

## 2021-04-23 DIAGNOSIS — R262 Difficulty in walking, not elsewhere classified: Secondary | ICD-10-CM | POA: Diagnosis not present

## 2021-04-23 DIAGNOSIS — M6281 Muscle weakness (generalized): Secondary | ICD-10-CM | POA: Diagnosis not present

## 2021-04-25 DIAGNOSIS — M6281 Muscle weakness (generalized): Secondary | ICD-10-CM | POA: Diagnosis not present

## 2021-04-25 DIAGNOSIS — R262 Difficulty in walking, not elsewhere classified: Secondary | ICD-10-CM | POA: Diagnosis not present

## 2021-04-25 DIAGNOSIS — R2681 Unsteadiness on feet: Secondary | ICD-10-CM | POA: Diagnosis not present

## 2021-04-25 DIAGNOSIS — G308 Other Alzheimer's disease: Secondary | ICD-10-CM | POA: Diagnosis not present

## 2021-04-25 DIAGNOSIS — F338 Other recurrent depressive disorders: Secondary | ICD-10-CM | POA: Diagnosis not present

## 2021-04-25 DIAGNOSIS — I951 Orthostatic hypotension: Secondary | ICD-10-CM | POA: Diagnosis not present

## 2021-04-28 DIAGNOSIS — R262 Difficulty in walking, not elsewhere classified: Secondary | ICD-10-CM | POA: Diagnosis not present

## 2021-04-28 DIAGNOSIS — F338 Other recurrent depressive disorders: Secondary | ICD-10-CM | POA: Diagnosis not present

## 2021-04-28 DIAGNOSIS — G308 Other Alzheimer's disease: Secondary | ICD-10-CM | POA: Diagnosis not present

## 2021-04-28 DIAGNOSIS — M6281 Muscle weakness (generalized): Secondary | ICD-10-CM | POA: Diagnosis not present

## 2021-04-28 DIAGNOSIS — R2681 Unsteadiness on feet: Secondary | ICD-10-CM | POA: Diagnosis not present

## 2021-04-28 DIAGNOSIS — I951 Orthostatic hypotension: Secondary | ICD-10-CM | POA: Diagnosis not present

## 2021-04-30 DIAGNOSIS — M6281 Muscle weakness (generalized): Secondary | ICD-10-CM | POA: Diagnosis not present

## 2021-04-30 DIAGNOSIS — R262 Difficulty in walking, not elsewhere classified: Secondary | ICD-10-CM | POA: Diagnosis not present

## 2021-04-30 DIAGNOSIS — G308 Other Alzheimer's disease: Secondary | ICD-10-CM | POA: Diagnosis not present

## 2021-04-30 DIAGNOSIS — I951 Orthostatic hypotension: Secondary | ICD-10-CM | POA: Diagnosis not present

## 2021-04-30 DIAGNOSIS — R2681 Unsteadiness on feet: Secondary | ICD-10-CM | POA: Diagnosis not present

## 2021-04-30 DIAGNOSIS — F338 Other recurrent depressive disorders: Secondary | ICD-10-CM | POA: Diagnosis not present

## 2021-05-02 DIAGNOSIS — G308 Other Alzheimer's disease: Secondary | ICD-10-CM | POA: Diagnosis not present

## 2021-05-02 DIAGNOSIS — R2681 Unsteadiness on feet: Secondary | ICD-10-CM | POA: Diagnosis not present

## 2021-05-02 DIAGNOSIS — I951 Orthostatic hypotension: Secondary | ICD-10-CM | POA: Diagnosis not present

## 2021-05-02 DIAGNOSIS — F338 Other recurrent depressive disorders: Secondary | ICD-10-CM | POA: Diagnosis not present

## 2021-05-02 DIAGNOSIS — R262 Difficulty in walking, not elsewhere classified: Secondary | ICD-10-CM | POA: Diagnosis not present

## 2021-05-02 DIAGNOSIS — M6281 Muscle weakness (generalized): Secondary | ICD-10-CM | POA: Diagnosis not present

## 2021-05-07 DIAGNOSIS — G308 Other Alzheimer's disease: Secondary | ICD-10-CM | POA: Diagnosis not present

## 2021-05-07 DIAGNOSIS — M6281 Muscle weakness (generalized): Secondary | ICD-10-CM | POA: Diagnosis not present

## 2021-05-07 DIAGNOSIS — J449 Chronic obstructive pulmonary disease, unspecified: Secondary | ICD-10-CM | POA: Diagnosis not present

## 2021-05-07 DIAGNOSIS — I4891 Unspecified atrial fibrillation: Secondary | ICD-10-CM | POA: Diagnosis not present

## 2021-05-07 DIAGNOSIS — I10 Essential (primary) hypertension: Secondary | ICD-10-CM | POA: Diagnosis not present

## 2021-05-07 DIAGNOSIS — F338 Other recurrent depressive disorders: Secondary | ICD-10-CM | POA: Diagnosis not present

## 2021-05-07 DIAGNOSIS — R2681 Unsteadiness on feet: Secondary | ICD-10-CM | POA: Diagnosis not present

## 2021-05-07 DIAGNOSIS — I951 Orthostatic hypotension: Secondary | ICD-10-CM | POA: Diagnosis not present

## 2021-05-07 DIAGNOSIS — N400 Enlarged prostate without lower urinary tract symptoms: Secondary | ICD-10-CM | POA: Diagnosis not present

## 2021-05-07 DIAGNOSIS — R262 Difficulty in walking, not elsewhere classified: Secondary | ICD-10-CM | POA: Diagnosis not present

## 2021-05-07 DIAGNOSIS — F0390 Unspecified dementia without behavioral disturbance: Secondary | ICD-10-CM | POA: Diagnosis not present

## 2021-05-09 DIAGNOSIS — G308 Other Alzheimer's disease: Secondary | ICD-10-CM | POA: Diagnosis not present

## 2021-05-09 DIAGNOSIS — R262 Difficulty in walking, not elsewhere classified: Secondary | ICD-10-CM | POA: Diagnosis not present

## 2021-05-09 DIAGNOSIS — M6281 Muscle weakness (generalized): Secondary | ICD-10-CM | POA: Diagnosis not present

## 2021-05-09 DIAGNOSIS — R2681 Unsteadiness on feet: Secondary | ICD-10-CM | POA: Diagnosis not present

## 2021-05-09 DIAGNOSIS — F338 Other recurrent depressive disorders: Secondary | ICD-10-CM | POA: Diagnosis not present

## 2021-05-09 DIAGNOSIS — I951 Orthostatic hypotension: Secondary | ICD-10-CM | POA: Diagnosis not present

## 2021-05-10 DIAGNOSIS — R262 Difficulty in walking, not elsewhere classified: Secondary | ICD-10-CM | POA: Diagnosis not present

## 2021-05-10 DIAGNOSIS — F338 Other recurrent depressive disorders: Secondary | ICD-10-CM | POA: Diagnosis not present

## 2021-05-10 DIAGNOSIS — G308 Other Alzheimer's disease: Secondary | ICD-10-CM | POA: Diagnosis not present

## 2021-05-10 DIAGNOSIS — R2681 Unsteadiness on feet: Secondary | ICD-10-CM | POA: Diagnosis not present

## 2021-05-10 DIAGNOSIS — M6281 Muscle weakness (generalized): Secondary | ICD-10-CM | POA: Diagnosis not present

## 2021-05-10 DIAGNOSIS — I951 Orthostatic hypotension: Secondary | ICD-10-CM | POA: Diagnosis not present

## 2021-05-11 DIAGNOSIS — D518 Other vitamin B12 deficiency anemias: Secondary | ICD-10-CM | POA: Diagnosis not present

## 2021-05-11 DIAGNOSIS — J449 Chronic obstructive pulmonary disease, unspecified: Secondary | ICD-10-CM | POA: Diagnosis not present

## 2021-05-11 DIAGNOSIS — E559 Vitamin D deficiency, unspecified: Secondary | ICD-10-CM | POA: Diagnosis not present

## 2021-05-11 DIAGNOSIS — I10 Essential (primary) hypertension: Secondary | ICD-10-CM | POA: Diagnosis not present

## 2021-05-11 DIAGNOSIS — I4891 Unspecified atrial fibrillation: Secondary | ICD-10-CM | POA: Diagnosis not present

## 2021-05-11 DIAGNOSIS — E119 Type 2 diabetes mellitus without complications: Secondary | ICD-10-CM | POA: Diagnosis not present

## 2021-05-11 DIAGNOSIS — N400 Enlarged prostate without lower urinary tract symptoms: Secondary | ICD-10-CM | POA: Diagnosis not present

## 2021-05-11 DIAGNOSIS — F0390 Unspecified dementia without behavioral disturbance: Secondary | ICD-10-CM | POA: Diagnosis not present

## 2021-05-11 DIAGNOSIS — F329 Major depressive disorder, single episode, unspecified: Secondary | ICD-10-CM | POA: Diagnosis not present

## 2021-05-11 DIAGNOSIS — E038 Other specified hypothyroidism: Secondary | ICD-10-CM | POA: Diagnosis not present

## 2021-05-11 DIAGNOSIS — N183 Chronic kidney disease, stage 3 unspecified: Secondary | ICD-10-CM | POA: Diagnosis not present

## 2021-05-12 DIAGNOSIS — G308 Other Alzheimer's disease: Secondary | ICD-10-CM | POA: Diagnosis not present

## 2021-05-12 DIAGNOSIS — I951 Orthostatic hypotension: Secondary | ICD-10-CM | POA: Diagnosis not present

## 2021-05-12 DIAGNOSIS — R262 Difficulty in walking, not elsewhere classified: Secondary | ICD-10-CM | POA: Diagnosis not present

## 2021-05-12 DIAGNOSIS — R2681 Unsteadiness on feet: Secondary | ICD-10-CM | POA: Diagnosis not present

## 2021-05-12 DIAGNOSIS — M6281 Muscle weakness (generalized): Secondary | ICD-10-CM | POA: Diagnosis not present

## 2021-05-12 DIAGNOSIS — F338 Other recurrent depressive disorders: Secondary | ICD-10-CM | POA: Diagnosis not present

## 2021-05-14 DIAGNOSIS — F338 Other recurrent depressive disorders: Secondary | ICD-10-CM | POA: Diagnosis not present

## 2021-05-14 DIAGNOSIS — I951 Orthostatic hypotension: Secondary | ICD-10-CM | POA: Diagnosis not present

## 2021-05-14 DIAGNOSIS — R262 Difficulty in walking, not elsewhere classified: Secondary | ICD-10-CM | POA: Diagnosis not present

## 2021-05-14 DIAGNOSIS — R2681 Unsteadiness on feet: Secondary | ICD-10-CM | POA: Diagnosis not present

## 2021-05-14 DIAGNOSIS — G308 Other Alzheimer's disease: Secondary | ICD-10-CM | POA: Diagnosis not present

## 2021-05-14 DIAGNOSIS — M6281 Muscle weakness (generalized): Secondary | ICD-10-CM | POA: Diagnosis not present

## 2021-05-16 DIAGNOSIS — R2681 Unsteadiness on feet: Secondary | ICD-10-CM | POA: Diagnosis not present

## 2021-05-16 DIAGNOSIS — R262 Difficulty in walking, not elsewhere classified: Secondary | ICD-10-CM | POA: Diagnosis not present

## 2021-05-16 DIAGNOSIS — M6281 Muscle weakness (generalized): Secondary | ICD-10-CM | POA: Diagnosis not present

## 2021-05-16 DIAGNOSIS — G308 Other Alzheimer's disease: Secondary | ICD-10-CM | POA: Diagnosis not present

## 2021-05-16 DIAGNOSIS — F338 Other recurrent depressive disorders: Secondary | ICD-10-CM | POA: Diagnosis not present

## 2021-05-16 DIAGNOSIS — I951 Orthostatic hypotension: Secondary | ICD-10-CM | POA: Diagnosis not present

## 2021-05-19 DIAGNOSIS — G308 Other Alzheimer's disease: Secondary | ICD-10-CM | POA: Diagnosis not present

## 2021-05-19 DIAGNOSIS — R2681 Unsteadiness on feet: Secondary | ICD-10-CM | POA: Diagnosis not present

## 2021-05-19 DIAGNOSIS — R262 Difficulty in walking, not elsewhere classified: Secondary | ICD-10-CM | POA: Diagnosis not present

## 2021-05-19 DIAGNOSIS — I951 Orthostatic hypotension: Secondary | ICD-10-CM | POA: Diagnosis not present

## 2021-05-19 DIAGNOSIS — F338 Other recurrent depressive disorders: Secondary | ICD-10-CM | POA: Diagnosis not present

## 2021-05-19 DIAGNOSIS — M6281 Muscle weakness (generalized): Secondary | ICD-10-CM | POA: Diagnosis not present

## 2021-05-21 DIAGNOSIS — I951 Orthostatic hypotension: Secondary | ICD-10-CM | POA: Diagnosis not present

## 2021-05-21 DIAGNOSIS — M6281 Muscle weakness (generalized): Secondary | ICD-10-CM | POA: Diagnosis not present

## 2021-05-21 DIAGNOSIS — F338 Other recurrent depressive disorders: Secondary | ICD-10-CM | POA: Diagnosis not present

## 2021-05-21 DIAGNOSIS — R2681 Unsteadiness on feet: Secondary | ICD-10-CM | POA: Diagnosis not present

## 2021-05-21 DIAGNOSIS — G308 Other Alzheimer's disease: Secondary | ICD-10-CM | POA: Diagnosis not present

## 2021-05-21 DIAGNOSIS — R262 Difficulty in walking, not elsewhere classified: Secondary | ICD-10-CM | POA: Diagnosis not present

## 2021-05-23 DIAGNOSIS — R2681 Unsteadiness on feet: Secondary | ICD-10-CM | POA: Diagnosis not present

## 2021-05-23 DIAGNOSIS — R262 Difficulty in walking, not elsewhere classified: Secondary | ICD-10-CM | POA: Diagnosis not present

## 2021-05-23 DIAGNOSIS — G308 Other Alzheimer's disease: Secondary | ICD-10-CM | POA: Diagnosis not present

## 2021-05-23 DIAGNOSIS — F338 Other recurrent depressive disorders: Secondary | ICD-10-CM | POA: Diagnosis not present

## 2021-05-23 DIAGNOSIS — M6281 Muscle weakness (generalized): Secondary | ICD-10-CM | POA: Diagnosis not present

## 2021-05-23 DIAGNOSIS — I951 Orthostatic hypotension: Secondary | ICD-10-CM | POA: Diagnosis not present

## 2021-05-26 DIAGNOSIS — R2681 Unsteadiness on feet: Secondary | ICD-10-CM | POA: Diagnosis not present

## 2021-05-26 DIAGNOSIS — R262 Difficulty in walking, not elsewhere classified: Secondary | ICD-10-CM | POA: Diagnosis not present

## 2021-05-26 DIAGNOSIS — I951 Orthostatic hypotension: Secondary | ICD-10-CM | POA: Diagnosis not present

## 2021-05-26 DIAGNOSIS — F338 Other recurrent depressive disorders: Secondary | ICD-10-CM | POA: Diagnosis not present

## 2021-05-26 DIAGNOSIS — G308 Other Alzheimer's disease: Secondary | ICD-10-CM | POA: Diagnosis not present

## 2021-05-26 DIAGNOSIS — M6281 Muscle weakness (generalized): Secondary | ICD-10-CM | POA: Diagnosis not present

## 2021-05-28 DIAGNOSIS — R262 Difficulty in walking, not elsewhere classified: Secondary | ICD-10-CM | POA: Diagnosis not present

## 2021-05-28 DIAGNOSIS — R2681 Unsteadiness on feet: Secondary | ICD-10-CM | POA: Diagnosis not present

## 2021-05-28 DIAGNOSIS — F338 Other recurrent depressive disorders: Secondary | ICD-10-CM | POA: Diagnosis not present

## 2021-05-28 DIAGNOSIS — G308 Other Alzheimer's disease: Secondary | ICD-10-CM | POA: Diagnosis not present

## 2021-05-28 DIAGNOSIS — M6281 Muscle weakness (generalized): Secondary | ICD-10-CM | POA: Diagnosis not present

## 2021-05-28 DIAGNOSIS — F0390 Unspecified dementia without behavioral disturbance: Secondary | ICD-10-CM | POA: Diagnosis not present

## 2021-05-28 DIAGNOSIS — F0391 Unspecified dementia with behavioral disturbance: Secondary | ICD-10-CM | POA: Diagnosis not present

## 2021-05-28 DIAGNOSIS — I951 Orthostatic hypotension: Secondary | ICD-10-CM | POA: Diagnosis not present

## 2021-05-29 DIAGNOSIS — E119 Type 2 diabetes mellitus without complications: Secondary | ICD-10-CM | POA: Diagnosis not present

## 2021-05-29 DIAGNOSIS — I4891 Unspecified atrial fibrillation: Secondary | ICD-10-CM | POA: Diagnosis not present

## 2021-05-29 DIAGNOSIS — F0391 Unspecified dementia with behavioral disturbance: Secondary | ICD-10-CM | POA: Diagnosis not present

## 2021-05-29 DIAGNOSIS — F0390 Unspecified dementia without behavioral disturbance: Secondary | ICD-10-CM | POA: Diagnosis not present

## 2021-05-29 DIAGNOSIS — J449 Chronic obstructive pulmonary disease, unspecified: Secondary | ICD-10-CM | POA: Diagnosis not present

## 2021-05-29 DIAGNOSIS — D518 Other vitamin B12 deficiency anemias: Secondary | ICD-10-CM | POA: Diagnosis not present

## 2021-05-29 DIAGNOSIS — E038 Other specified hypothyroidism: Secondary | ICD-10-CM | POA: Diagnosis not present

## 2021-05-29 DIAGNOSIS — E559 Vitamin D deficiency, unspecified: Secondary | ICD-10-CM | POA: Diagnosis not present

## 2021-05-29 DIAGNOSIS — F329 Major depressive disorder, single episode, unspecified: Secondary | ICD-10-CM | POA: Diagnosis not present

## 2021-05-29 DIAGNOSIS — N183 Chronic kidney disease, stage 3 unspecified: Secondary | ICD-10-CM | POA: Diagnosis not present

## 2021-05-29 DIAGNOSIS — I10 Essential (primary) hypertension: Secondary | ICD-10-CM | POA: Diagnosis not present

## 2021-05-29 DIAGNOSIS — N400 Enlarged prostate without lower urinary tract symptoms: Secondary | ICD-10-CM | POA: Diagnosis not present

## 2021-05-30 DIAGNOSIS — F338 Other recurrent depressive disorders: Secondary | ICD-10-CM | POA: Diagnosis not present

## 2021-05-30 DIAGNOSIS — I951 Orthostatic hypotension: Secondary | ICD-10-CM | POA: Diagnosis not present

## 2021-05-30 DIAGNOSIS — G308 Other Alzheimer's disease: Secondary | ICD-10-CM | POA: Diagnosis not present

## 2021-05-30 DIAGNOSIS — R262 Difficulty in walking, not elsewhere classified: Secondary | ICD-10-CM | POA: Diagnosis not present

## 2021-05-30 DIAGNOSIS — M6281 Muscle weakness (generalized): Secondary | ICD-10-CM | POA: Diagnosis not present

## 2021-05-30 DIAGNOSIS — R2681 Unsteadiness on feet: Secondary | ICD-10-CM | POA: Diagnosis not present

## 2021-06-02 DIAGNOSIS — R4182 Altered mental status, unspecified: Secondary | ICD-10-CM | POA: Diagnosis not present

## 2021-06-02 DIAGNOSIS — G308 Other Alzheimer's disease: Secondary | ICD-10-CM | POA: Diagnosis not present

## 2021-06-02 DIAGNOSIS — I951 Orthostatic hypotension: Secondary | ICD-10-CM | POA: Diagnosis not present

## 2021-06-02 DIAGNOSIS — M6281 Muscle weakness (generalized): Secondary | ICD-10-CM | POA: Diagnosis not present

## 2021-06-02 DIAGNOSIS — F338 Other recurrent depressive disorders: Secondary | ICD-10-CM | POA: Diagnosis not present

## 2021-06-02 DIAGNOSIS — R2681 Unsteadiness on feet: Secondary | ICD-10-CM | POA: Diagnosis not present

## 2021-06-02 DIAGNOSIS — R262 Difficulty in walking, not elsewhere classified: Secondary | ICD-10-CM | POA: Diagnosis not present

## 2021-06-04 DIAGNOSIS — F329 Major depressive disorder, single episode, unspecified: Secondary | ICD-10-CM | POA: Diagnosis not present

## 2021-06-04 DIAGNOSIS — R262 Difficulty in walking, not elsewhere classified: Secondary | ICD-10-CM | POA: Diagnosis not present

## 2021-06-04 DIAGNOSIS — N400 Enlarged prostate without lower urinary tract symptoms: Secondary | ICD-10-CM | POA: Diagnosis not present

## 2021-06-04 DIAGNOSIS — I10 Essential (primary) hypertension: Secondary | ICD-10-CM | POA: Diagnosis not present

## 2021-06-04 DIAGNOSIS — F0390 Unspecified dementia without behavioral disturbance: Secondary | ICD-10-CM | POA: Diagnosis not present

## 2021-06-04 DIAGNOSIS — I951 Orthostatic hypotension: Secondary | ICD-10-CM | POA: Diagnosis not present

## 2021-06-04 DIAGNOSIS — M6281 Muscle weakness (generalized): Secondary | ICD-10-CM | POA: Diagnosis not present

## 2021-06-04 DIAGNOSIS — J449 Chronic obstructive pulmonary disease, unspecified: Secondary | ICD-10-CM | POA: Diagnosis not present

## 2021-06-04 DIAGNOSIS — F338 Other recurrent depressive disorders: Secondary | ICD-10-CM | POA: Diagnosis not present

## 2021-06-04 DIAGNOSIS — R2681 Unsteadiness on feet: Secondary | ICD-10-CM | POA: Diagnosis not present

## 2021-06-04 DIAGNOSIS — I4891 Unspecified atrial fibrillation: Secondary | ICD-10-CM | POA: Diagnosis not present

## 2021-06-04 DIAGNOSIS — G308 Other Alzheimer's disease: Secondary | ICD-10-CM | POA: Diagnosis not present

## 2021-06-05 DIAGNOSIS — E119 Type 2 diabetes mellitus without complications: Secondary | ICD-10-CM | POA: Diagnosis not present

## 2021-06-05 DIAGNOSIS — E559 Vitamin D deficiency, unspecified: Secondary | ICD-10-CM | POA: Diagnosis not present

## 2021-06-05 DIAGNOSIS — D518 Other vitamin B12 deficiency anemias: Secondary | ICD-10-CM | POA: Diagnosis not present

## 2021-06-05 DIAGNOSIS — Z79899 Other long term (current) drug therapy: Secondary | ICD-10-CM | POA: Diagnosis not present

## 2021-06-05 DIAGNOSIS — F5102 Adjustment insomnia: Secondary | ICD-10-CM | POA: Diagnosis not present

## 2021-06-05 DIAGNOSIS — R451 Restlessness and agitation: Secondary | ICD-10-CM | POA: Diagnosis not present

## 2021-06-05 DIAGNOSIS — E038 Other specified hypothyroidism: Secondary | ICD-10-CM | POA: Diagnosis not present

## 2021-06-05 DIAGNOSIS — F0391 Unspecified dementia with behavioral disturbance: Secondary | ICD-10-CM | POA: Diagnosis not present

## 2021-06-05 DIAGNOSIS — E612 Magnesium deficiency: Secondary | ICD-10-CM | POA: Diagnosis not present

## 2021-06-05 DIAGNOSIS — F419 Anxiety disorder, unspecified: Secondary | ICD-10-CM | POA: Diagnosis not present

## 2021-06-05 DIAGNOSIS — E7849 Other hyperlipidemia: Secondary | ICD-10-CM | POA: Diagnosis not present

## 2021-06-06 DIAGNOSIS — E261 Secondary hyperaldosteronism: Secondary | ICD-10-CM | POA: Diagnosis not present

## 2021-06-06 DIAGNOSIS — G309 Alzheimer's disease, unspecified: Secondary | ICD-10-CM | POA: Diagnosis not present

## 2021-06-06 DIAGNOSIS — F1997 Other psychoactive substance use, unspecified with psychoactive substance-induced persisting dementia: Secondary | ICD-10-CM | POA: Diagnosis not present

## 2021-06-06 DIAGNOSIS — D692 Other nonthrombocytopenic purpura: Secondary | ICD-10-CM | POA: Diagnosis not present

## 2021-06-06 DIAGNOSIS — I509 Heart failure, unspecified: Secondary | ICD-10-CM | POA: Diagnosis not present

## 2021-06-06 DIAGNOSIS — F338 Other recurrent depressive disorders: Secondary | ICD-10-CM | POA: Diagnosis not present

## 2021-06-06 DIAGNOSIS — G308 Other Alzheimer's disease: Secondary | ICD-10-CM | POA: Diagnosis not present

## 2021-06-06 DIAGNOSIS — M6281 Muscle weakness (generalized): Secondary | ICD-10-CM | POA: Diagnosis not present

## 2021-06-06 DIAGNOSIS — I951 Orthostatic hypotension: Secondary | ICD-10-CM | POA: Diagnosis not present

## 2021-06-06 DIAGNOSIS — R2681 Unsteadiness on feet: Secondary | ICD-10-CM | POA: Diagnosis not present

## 2021-06-06 DIAGNOSIS — F0280 Dementia in other diseases classified elsewhere without behavioral disturbance: Secondary | ICD-10-CM | POA: Diagnosis not present

## 2021-06-06 DIAGNOSIS — R262 Difficulty in walking, not elsewhere classified: Secondary | ICD-10-CM | POA: Diagnosis not present

## 2021-06-06 DIAGNOSIS — I739 Peripheral vascular disease, unspecified: Secondary | ICD-10-CM | POA: Diagnosis not present

## 2021-06-09 DIAGNOSIS — F419 Anxiety disorder, unspecified: Secondary | ICD-10-CM | POA: Diagnosis not present

## 2021-06-19 DIAGNOSIS — J449 Chronic obstructive pulmonary disease, unspecified: Secondary | ICD-10-CM | POA: Diagnosis not present

## 2021-06-19 DIAGNOSIS — F5102 Adjustment insomnia: Secondary | ICD-10-CM | POA: Diagnosis not present

## 2021-06-19 DIAGNOSIS — R451 Restlessness and agitation: Secondary | ICD-10-CM | POA: Diagnosis not present

## 2021-06-19 DIAGNOSIS — F419 Anxiety disorder, unspecified: Secondary | ICD-10-CM | POA: Diagnosis not present

## 2021-06-19 DIAGNOSIS — F0391 Unspecified dementia with behavioral disturbance: Secondary | ICD-10-CM | POA: Diagnosis not present

## 2021-07-01 DIAGNOSIS — F329 Major depressive disorder, single episode, unspecified: Secondary | ICD-10-CM | POA: Diagnosis not present

## 2021-07-01 DIAGNOSIS — F0391 Unspecified dementia with behavioral disturbance: Secondary | ICD-10-CM | POA: Diagnosis not present

## 2021-07-01 DIAGNOSIS — N183 Chronic kidney disease, stage 3 unspecified: Secondary | ICD-10-CM | POA: Diagnosis not present

## 2021-07-01 DIAGNOSIS — J449 Chronic obstructive pulmonary disease, unspecified: Secondary | ICD-10-CM | POA: Diagnosis not present

## 2021-07-01 DIAGNOSIS — R600 Localized edema: Secondary | ICD-10-CM | POA: Diagnosis not present

## 2021-07-01 DIAGNOSIS — I10 Essential (primary) hypertension: Secondary | ICD-10-CM | POA: Diagnosis not present

## 2021-07-01 DIAGNOSIS — N400 Enlarged prostate without lower urinary tract symptoms: Secondary | ICD-10-CM | POA: Diagnosis not present

## 2021-07-01 DIAGNOSIS — I4891 Unspecified atrial fibrillation: Secondary | ICD-10-CM | POA: Diagnosis not present

## 2021-07-03 DIAGNOSIS — R451 Restlessness and agitation: Secondary | ICD-10-CM | POA: Diagnosis not present

## 2021-07-03 DIAGNOSIS — F419 Anxiety disorder, unspecified: Secondary | ICD-10-CM | POA: Diagnosis not present

## 2021-07-03 DIAGNOSIS — F5102 Adjustment insomnia: Secondary | ICD-10-CM | POA: Diagnosis not present

## 2021-07-03 DIAGNOSIS — F0391 Unspecified dementia with behavioral disturbance: Secondary | ICD-10-CM | POA: Diagnosis not present

## 2021-07-07 DIAGNOSIS — D518 Other vitamin B12 deficiency anemias: Secondary | ICD-10-CM | POA: Diagnosis not present

## 2021-07-07 DIAGNOSIS — E559 Vitamin D deficiency, unspecified: Secondary | ICD-10-CM | POA: Diagnosis not present

## 2021-07-07 DIAGNOSIS — Z79899 Other long term (current) drug therapy: Secondary | ICD-10-CM | POA: Diagnosis not present

## 2021-07-07 DIAGNOSIS — E7849 Other hyperlipidemia: Secondary | ICD-10-CM | POA: Diagnosis not present

## 2021-07-07 DIAGNOSIS — E038 Other specified hypothyroidism: Secondary | ICD-10-CM | POA: Diagnosis not present

## 2021-07-07 DIAGNOSIS — E119 Type 2 diabetes mellitus without complications: Secondary | ICD-10-CM | POA: Diagnosis not present

## 2021-07-08 DIAGNOSIS — E559 Vitamin D deficiency, unspecified: Secondary | ICD-10-CM | POA: Diagnosis not present

## 2021-07-08 DIAGNOSIS — N183 Chronic kidney disease, stage 3 unspecified: Secondary | ICD-10-CM | POA: Diagnosis not present

## 2021-07-08 DIAGNOSIS — F0390 Unspecified dementia without behavioral disturbance: Secondary | ICD-10-CM | POA: Diagnosis not present

## 2021-07-08 DIAGNOSIS — J449 Chronic obstructive pulmonary disease, unspecified: Secondary | ICD-10-CM | POA: Diagnosis not present

## 2021-07-08 DIAGNOSIS — F329 Major depressive disorder, single episode, unspecified: Secondary | ICD-10-CM | POA: Diagnosis not present

## 2021-07-08 DIAGNOSIS — I10 Essential (primary) hypertension: Secondary | ICD-10-CM | POA: Diagnosis not present

## 2021-07-08 DIAGNOSIS — I4891 Unspecified atrial fibrillation: Secondary | ICD-10-CM | POA: Diagnosis not present

## 2021-07-08 DIAGNOSIS — F0391 Unspecified dementia with behavioral disturbance: Secondary | ICD-10-CM | POA: Diagnosis not present

## 2021-07-08 DIAGNOSIS — E038 Other specified hypothyroidism: Secondary | ICD-10-CM | POA: Diagnosis not present

## 2021-07-08 DIAGNOSIS — N400 Enlarged prostate without lower urinary tract symptoms: Secondary | ICD-10-CM | POA: Diagnosis not present

## 2021-07-08 DIAGNOSIS — D518 Other vitamin B12 deficiency anemias: Secondary | ICD-10-CM | POA: Diagnosis not present

## 2021-07-08 DIAGNOSIS — E119 Type 2 diabetes mellitus without complications: Secondary | ICD-10-CM | POA: Diagnosis not present

## 2021-07-29 DIAGNOSIS — F0391 Unspecified dementia with behavioral disturbance: Secondary | ICD-10-CM | POA: Diagnosis not present

## 2021-07-29 DIAGNOSIS — I10 Essential (primary) hypertension: Secondary | ICD-10-CM | POA: Diagnosis not present

## 2021-07-29 DIAGNOSIS — J449 Chronic obstructive pulmonary disease, unspecified: Secondary | ICD-10-CM | POA: Diagnosis not present

## 2021-07-29 DIAGNOSIS — R011 Cardiac murmur, unspecified: Secondary | ICD-10-CM | POA: Diagnosis not present

## 2021-07-29 DIAGNOSIS — N183 Chronic kidney disease, stage 3 unspecified: Secondary | ICD-10-CM | POA: Diagnosis not present

## 2021-07-29 DIAGNOSIS — R600 Localized edema: Secondary | ICD-10-CM | POA: Diagnosis not present

## 2021-07-29 DIAGNOSIS — N400 Enlarged prostate without lower urinary tract symptoms: Secondary | ICD-10-CM | POA: Diagnosis not present

## 2021-07-29 DIAGNOSIS — I4891 Unspecified atrial fibrillation: Secondary | ICD-10-CM | POA: Diagnosis not present

## 2021-07-29 DIAGNOSIS — E559 Vitamin D deficiency, unspecified: Secondary | ICD-10-CM | POA: Diagnosis not present

## 2021-07-29 DIAGNOSIS — F329 Major depressive disorder, single episode, unspecified: Secondary | ICD-10-CM | POA: Diagnosis not present

## 2021-07-31 DIAGNOSIS — D518 Other vitamin B12 deficiency anemias: Secondary | ICD-10-CM | POA: Diagnosis not present

## 2021-07-31 DIAGNOSIS — F0391 Unspecified dementia with behavioral disturbance: Secondary | ICD-10-CM | POA: Diagnosis not present

## 2021-07-31 DIAGNOSIS — R451 Restlessness and agitation: Secondary | ICD-10-CM | POA: Diagnosis not present

## 2021-07-31 DIAGNOSIS — J449 Chronic obstructive pulmonary disease, unspecified: Secondary | ICD-10-CM | POA: Diagnosis not present

## 2021-07-31 DIAGNOSIS — I4891 Unspecified atrial fibrillation: Secondary | ICD-10-CM | POA: Diagnosis not present

## 2021-07-31 DIAGNOSIS — F419 Anxiety disorder, unspecified: Secondary | ICD-10-CM | POA: Diagnosis not present

## 2021-07-31 DIAGNOSIS — F329 Major depressive disorder, single episode, unspecified: Secondary | ICD-10-CM | POA: Diagnosis not present

## 2021-07-31 DIAGNOSIS — N183 Chronic kidney disease, stage 3 unspecified: Secondary | ICD-10-CM | POA: Diagnosis not present

## 2021-07-31 DIAGNOSIS — E119 Type 2 diabetes mellitus without complications: Secondary | ICD-10-CM | POA: Diagnosis not present

## 2021-07-31 DIAGNOSIS — N400 Enlarged prostate without lower urinary tract symptoms: Secondary | ICD-10-CM | POA: Diagnosis not present

## 2021-07-31 DIAGNOSIS — F5102 Adjustment insomnia: Secondary | ICD-10-CM | POA: Diagnosis not present

## 2021-07-31 DIAGNOSIS — I10 Essential (primary) hypertension: Secondary | ICD-10-CM | POA: Diagnosis not present

## 2021-07-31 DIAGNOSIS — E038 Other specified hypothyroidism: Secondary | ICD-10-CM | POA: Diagnosis not present

## 2021-07-31 DIAGNOSIS — F0390 Unspecified dementia without behavioral disturbance: Secondary | ICD-10-CM | POA: Diagnosis not present

## 2021-07-31 DIAGNOSIS — E559 Vitamin D deficiency, unspecified: Secondary | ICD-10-CM | POA: Diagnosis not present

## 2021-08-12 DIAGNOSIS — L84 Corns and callosities: Secondary | ICD-10-CM | POA: Diagnosis not present

## 2021-08-12 DIAGNOSIS — Z79899 Other long term (current) drug therapy: Secondary | ICD-10-CM | POA: Diagnosis not present

## 2021-08-22 DIAGNOSIS — J449 Chronic obstructive pulmonary disease, unspecified: Secondary | ICD-10-CM | POA: Diagnosis not present

## 2021-08-23 DIAGNOSIS — I951 Orthostatic hypotension: Secondary | ICD-10-CM | POA: Diagnosis not present

## 2021-08-23 DIAGNOSIS — G308 Other Alzheimer's disease: Secondary | ICD-10-CM | POA: Diagnosis not present

## 2021-08-23 DIAGNOSIS — R2681 Unsteadiness on feet: Secondary | ICD-10-CM | POA: Diagnosis not present

## 2021-08-23 DIAGNOSIS — R262 Difficulty in walking, not elsewhere classified: Secondary | ICD-10-CM | POA: Diagnosis not present

## 2021-08-23 DIAGNOSIS — F338 Other recurrent depressive disorders: Secondary | ICD-10-CM | POA: Diagnosis not present

## 2021-08-23 DIAGNOSIS — M6281 Muscle weakness (generalized): Secondary | ICD-10-CM | POA: Diagnosis not present

## 2021-08-25 DIAGNOSIS — R2681 Unsteadiness on feet: Secondary | ICD-10-CM | POA: Diagnosis not present

## 2021-08-25 DIAGNOSIS — F338 Other recurrent depressive disorders: Secondary | ICD-10-CM | POA: Diagnosis not present

## 2021-08-25 DIAGNOSIS — I951 Orthostatic hypotension: Secondary | ICD-10-CM | POA: Diagnosis not present

## 2021-08-25 DIAGNOSIS — R262 Difficulty in walking, not elsewhere classified: Secondary | ICD-10-CM | POA: Diagnosis not present

## 2021-08-25 DIAGNOSIS — G308 Other Alzheimer's disease: Secondary | ICD-10-CM | POA: Diagnosis not present

## 2021-08-25 DIAGNOSIS — M6281 Muscle weakness (generalized): Secondary | ICD-10-CM | POA: Diagnosis not present

## 2021-08-26 DIAGNOSIS — J449 Chronic obstructive pulmonary disease, unspecified: Secondary | ICD-10-CM | POA: Diagnosis not present

## 2021-08-26 DIAGNOSIS — F0391 Unspecified dementia with behavioral disturbance: Secondary | ICD-10-CM | POA: Diagnosis not present

## 2021-08-26 DIAGNOSIS — N183 Chronic kidney disease, stage 3 unspecified: Secondary | ICD-10-CM | POA: Diagnosis not present

## 2021-08-26 DIAGNOSIS — E559 Vitamin D deficiency, unspecified: Secondary | ICD-10-CM | POA: Diagnosis not present

## 2021-08-26 DIAGNOSIS — N400 Enlarged prostate without lower urinary tract symptoms: Secondary | ICD-10-CM | POA: Diagnosis not present

## 2021-08-26 DIAGNOSIS — R600 Localized edema: Secondary | ICD-10-CM | POA: Diagnosis not present

## 2021-08-27 DIAGNOSIS — Z043 Encounter for examination and observation following other accident: Secondary | ICD-10-CM | POA: Diagnosis not present

## 2021-08-27 DIAGNOSIS — I959 Hypotension, unspecified: Secondary | ICD-10-CM | POA: Diagnosis not present

## 2021-08-27 DIAGNOSIS — M50321 Other cervical disc degeneration at C4-C5 level: Secondary | ICD-10-CM | POA: Diagnosis not present

## 2021-08-27 DIAGNOSIS — N183 Chronic kidney disease, stage 3 unspecified: Secondary | ICD-10-CM | POA: Diagnosis not present

## 2021-08-27 DIAGNOSIS — F0390 Unspecified dementia without behavioral disturbance: Secondary | ICD-10-CM | POA: Diagnosis not present

## 2021-08-27 DIAGNOSIS — M5030 Other cervical disc degeneration, unspecified cervical region: Secondary | ICD-10-CM | POA: Diagnosis not present

## 2021-08-27 DIAGNOSIS — R9431 Abnormal electrocardiogram [ECG] [EKG]: Secondary | ICD-10-CM | POA: Diagnosis not present

## 2021-08-27 DIAGNOSIS — R079 Chest pain, unspecified: Secondary | ICD-10-CM | POA: Diagnosis not present

## 2021-08-27 DIAGNOSIS — I6789 Other cerebrovascular disease: Secondary | ICD-10-CM | POA: Diagnosis not present

## 2021-08-27 DIAGNOSIS — J9811 Atelectasis: Secondary | ICD-10-CM | POA: Diagnosis not present

## 2021-08-27 DIAGNOSIS — J449 Chronic obstructive pulmonary disease, unspecified: Secondary | ICD-10-CM | POA: Diagnosis not present

## 2021-08-27 DIAGNOSIS — I482 Chronic atrial fibrillation, unspecified: Secondary | ICD-10-CM | POA: Diagnosis not present

## 2021-08-27 DIAGNOSIS — Z7901 Long term (current) use of anticoagulants: Secondary | ICD-10-CM | POA: Diagnosis not present

## 2021-08-27 DIAGNOSIS — R296 Repeated falls: Secondary | ICD-10-CM | POA: Diagnosis not present

## 2021-08-27 DIAGNOSIS — W19XXXA Unspecified fall, initial encounter: Secondary | ICD-10-CM | POA: Diagnosis not present

## 2021-08-27 DIAGNOSIS — I517 Cardiomegaly: Secondary | ICD-10-CM | POA: Diagnosis not present

## 2021-08-27 DIAGNOSIS — I452 Bifascicular block: Secondary | ICD-10-CM | POA: Diagnosis not present

## 2021-08-27 DIAGNOSIS — Z9181 History of falling: Secondary | ICD-10-CM | POA: Diagnosis not present

## 2021-08-27 DIAGNOSIS — R001 Bradycardia, unspecified: Secondary | ICD-10-CM | POA: Diagnosis not present

## 2021-08-27 DIAGNOSIS — I059 Rheumatic mitral valve disease, unspecified: Secondary | ICD-10-CM | POA: Diagnosis not present

## 2021-08-27 DIAGNOSIS — R0902 Hypoxemia: Secondary | ICD-10-CM | POA: Diagnosis not present

## 2021-08-27 DIAGNOSIS — S0990XA Unspecified injury of head, initial encounter: Secondary | ICD-10-CM | POA: Diagnosis not present

## 2021-08-27 DIAGNOSIS — R42 Dizziness and giddiness: Secondary | ICD-10-CM | POA: Diagnosis not present

## 2021-08-27 DIAGNOSIS — G319 Degenerative disease of nervous system, unspecified: Secondary | ICD-10-CM | POA: Diagnosis not present

## 2021-08-27 DIAGNOSIS — W07XXXA Fall from chair, initial encounter: Secondary | ICD-10-CM | POA: Diagnosis not present

## 2021-08-28 DIAGNOSIS — F419 Anxiety disorder, unspecified: Secondary | ICD-10-CM | POA: Diagnosis not present

## 2021-08-28 DIAGNOSIS — F0391 Unspecified dementia with behavioral disturbance: Secondary | ICD-10-CM | POA: Diagnosis not present

## 2021-08-28 DIAGNOSIS — F5102 Adjustment insomnia: Secondary | ICD-10-CM | POA: Diagnosis not present

## 2021-08-29 DIAGNOSIS — R2681 Unsteadiness on feet: Secondary | ICD-10-CM | POA: Diagnosis not present

## 2021-08-29 DIAGNOSIS — I951 Orthostatic hypotension: Secondary | ICD-10-CM | POA: Diagnosis not present

## 2021-08-29 DIAGNOSIS — G308 Other Alzheimer's disease: Secondary | ICD-10-CM | POA: Diagnosis not present

## 2021-08-29 DIAGNOSIS — M6281 Muscle weakness (generalized): Secondary | ICD-10-CM | POA: Diagnosis not present

## 2021-08-29 DIAGNOSIS — F338 Other recurrent depressive disorders: Secondary | ICD-10-CM | POA: Diagnosis not present

## 2021-08-29 DIAGNOSIS — R262 Difficulty in walking, not elsewhere classified: Secondary | ICD-10-CM | POA: Diagnosis not present

## 2021-09-01 DIAGNOSIS — M6281 Muscle weakness (generalized): Secondary | ICD-10-CM | POA: Diagnosis not present

## 2021-09-01 DIAGNOSIS — F338 Other recurrent depressive disorders: Secondary | ICD-10-CM | POA: Diagnosis not present

## 2021-09-01 DIAGNOSIS — R262 Difficulty in walking, not elsewhere classified: Secondary | ICD-10-CM | POA: Diagnosis not present

## 2021-09-01 DIAGNOSIS — I951 Orthostatic hypotension: Secondary | ICD-10-CM | POA: Diagnosis not present

## 2021-09-01 DIAGNOSIS — G308 Other Alzheimer's disease: Secondary | ICD-10-CM | POA: Diagnosis not present

## 2021-09-01 DIAGNOSIS — R2681 Unsteadiness on feet: Secondary | ICD-10-CM | POA: Diagnosis not present

## 2021-09-03 DIAGNOSIS — R262 Difficulty in walking, not elsewhere classified: Secondary | ICD-10-CM | POA: Diagnosis not present

## 2021-09-03 DIAGNOSIS — F338 Other recurrent depressive disorders: Secondary | ICD-10-CM | POA: Diagnosis not present

## 2021-09-03 DIAGNOSIS — R2681 Unsteadiness on feet: Secondary | ICD-10-CM | POA: Diagnosis not present

## 2021-09-03 DIAGNOSIS — G308 Other Alzheimer's disease: Secondary | ICD-10-CM | POA: Diagnosis not present

## 2021-09-03 DIAGNOSIS — I951 Orthostatic hypotension: Secondary | ICD-10-CM | POA: Diagnosis not present

## 2021-09-03 DIAGNOSIS — M6281 Muscle weakness (generalized): Secondary | ICD-10-CM | POA: Diagnosis not present

## 2021-09-05 DIAGNOSIS — F0390 Unspecified dementia without behavioral disturbance: Secondary | ICD-10-CM | POA: Diagnosis not present

## 2021-09-05 DIAGNOSIS — J449 Chronic obstructive pulmonary disease, unspecified: Secondary | ICD-10-CM | POA: Diagnosis not present

## 2021-09-05 DIAGNOSIS — R2681 Unsteadiness on feet: Secondary | ICD-10-CM | POA: Diagnosis not present

## 2021-09-05 DIAGNOSIS — D518 Other vitamin B12 deficiency anemias: Secondary | ICD-10-CM | POA: Diagnosis not present

## 2021-09-05 DIAGNOSIS — F338 Other recurrent depressive disorders: Secondary | ICD-10-CM | POA: Diagnosis not present

## 2021-09-05 DIAGNOSIS — E119 Type 2 diabetes mellitus without complications: Secondary | ICD-10-CM | POA: Diagnosis not present

## 2021-09-05 DIAGNOSIS — I10 Essential (primary) hypertension: Secondary | ICD-10-CM | POA: Diagnosis not present

## 2021-09-05 DIAGNOSIS — G308 Other Alzheimer's disease: Secondary | ICD-10-CM | POA: Diagnosis not present

## 2021-09-05 DIAGNOSIS — N400 Enlarged prostate without lower urinary tract symptoms: Secondary | ICD-10-CM | POA: Diagnosis not present

## 2021-09-05 DIAGNOSIS — E038 Other specified hypothyroidism: Secondary | ICD-10-CM | POA: Diagnosis not present

## 2021-09-05 DIAGNOSIS — N183 Chronic kidney disease, stage 3 unspecified: Secondary | ICD-10-CM | POA: Diagnosis not present

## 2021-09-05 DIAGNOSIS — R262 Difficulty in walking, not elsewhere classified: Secondary | ICD-10-CM | POA: Diagnosis not present

## 2021-09-05 DIAGNOSIS — E559 Vitamin D deficiency, unspecified: Secondary | ICD-10-CM | POA: Diagnosis not present

## 2021-09-05 DIAGNOSIS — I951 Orthostatic hypotension: Secondary | ICD-10-CM | POA: Diagnosis not present

## 2021-09-05 DIAGNOSIS — F0391 Unspecified dementia with behavioral disturbance: Secondary | ICD-10-CM | POA: Diagnosis not present

## 2021-09-05 DIAGNOSIS — F329 Major depressive disorder, single episode, unspecified: Secondary | ICD-10-CM | POA: Diagnosis not present

## 2021-09-05 DIAGNOSIS — I4891 Unspecified atrial fibrillation: Secondary | ICD-10-CM | POA: Diagnosis not present

## 2021-09-05 DIAGNOSIS — M6281 Muscle weakness (generalized): Secondary | ICD-10-CM | POA: Diagnosis not present

## 2021-09-06 DIAGNOSIS — Z743 Need for continuous supervision: Secondary | ICD-10-CM | POA: Diagnosis not present

## 2021-09-06 DIAGNOSIS — R404 Transient alteration of awareness: Secondary | ICD-10-CM | POA: Diagnosis not present

## 2021-09-06 DIAGNOSIS — R531 Weakness: Secondary | ICD-10-CM | POA: Diagnosis not present

## 2021-09-06 DIAGNOSIS — R296 Repeated falls: Secondary | ICD-10-CM | POA: Diagnosis not present

## 2021-09-06 DIAGNOSIS — R9082 White matter disease, unspecified: Secondary | ICD-10-CM | POA: Diagnosis not present

## 2021-09-06 DIAGNOSIS — G319 Degenerative disease of nervous system, unspecified: Secondary | ICD-10-CM | POA: Diagnosis not present

## 2021-09-06 DIAGNOSIS — S0083XA Contusion of other part of head, initial encounter: Secondary | ICD-10-CM | POA: Diagnosis not present

## 2021-09-06 DIAGNOSIS — S0990XA Unspecified injury of head, initial encounter: Secondary | ICD-10-CM | POA: Diagnosis not present

## 2021-09-08 DIAGNOSIS — R262 Difficulty in walking, not elsewhere classified: Secondary | ICD-10-CM | POA: Diagnosis not present

## 2021-09-08 DIAGNOSIS — G308 Other Alzheimer's disease: Secondary | ICD-10-CM | POA: Diagnosis not present

## 2021-09-08 DIAGNOSIS — R2681 Unsteadiness on feet: Secondary | ICD-10-CM | POA: Diagnosis not present

## 2021-09-08 DIAGNOSIS — M6281 Muscle weakness (generalized): Secondary | ICD-10-CM | POA: Diagnosis not present

## 2021-09-08 DIAGNOSIS — I951 Orthostatic hypotension: Secondary | ICD-10-CM | POA: Diagnosis not present

## 2021-09-08 DIAGNOSIS — F338 Other recurrent depressive disorders: Secondary | ICD-10-CM | POA: Diagnosis not present

## 2021-09-12 DIAGNOSIS — M6281 Muscle weakness (generalized): Secondary | ICD-10-CM | POA: Diagnosis not present

## 2021-09-12 DIAGNOSIS — R2681 Unsteadiness on feet: Secondary | ICD-10-CM | POA: Diagnosis not present

## 2021-09-12 DIAGNOSIS — I951 Orthostatic hypotension: Secondary | ICD-10-CM | POA: Diagnosis not present

## 2021-09-12 DIAGNOSIS — F338 Other recurrent depressive disorders: Secondary | ICD-10-CM | POA: Diagnosis not present

## 2021-09-12 DIAGNOSIS — G308 Other Alzheimer's disease: Secondary | ICD-10-CM | POA: Diagnosis not present

## 2021-09-12 DIAGNOSIS — R262 Difficulty in walking, not elsewhere classified: Secondary | ICD-10-CM | POA: Diagnosis not present

## 2021-09-17 DIAGNOSIS — R262 Difficulty in walking, not elsewhere classified: Secondary | ICD-10-CM | POA: Diagnosis not present

## 2021-09-17 DIAGNOSIS — M6281 Muscle weakness (generalized): Secondary | ICD-10-CM | POA: Diagnosis not present

## 2021-09-17 DIAGNOSIS — G308 Other Alzheimer's disease: Secondary | ICD-10-CM | POA: Diagnosis not present

## 2021-09-17 DIAGNOSIS — R2681 Unsteadiness on feet: Secondary | ICD-10-CM | POA: Diagnosis not present

## 2021-09-17 DIAGNOSIS — F338 Other recurrent depressive disorders: Secondary | ICD-10-CM | POA: Diagnosis not present

## 2021-09-17 DIAGNOSIS — I951 Orthostatic hypotension: Secondary | ICD-10-CM | POA: Diagnosis not present

## 2021-09-19 DIAGNOSIS — F338 Other recurrent depressive disorders: Secondary | ICD-10-CM | POA: Diagnosis not present

## 2021-09-19 DIAGNOSIS — G308 Other Alzheimer's disease: Secondary | ICD-10-CM | POA: Diagnosis not present

## 2021-09-19 DIAGNOSIS — R262 Difficulty in walking, not elsewhere classified: Secondary | ICD-10-CM | POA: Diagnosis not present

## 2021-09-19 DIAGNOSIS — M6281 Muscle weakness (generalized): Secondary | ICD-10-CM | POA: Diagnosis not present

## 2021-09-19 DIAGNOSIS — I951 Orthostatic hypotension: Secondary | ICD-10-CM | POA: Diagnosis not present

## 2021-09-19 DIAGNOSIS — R2681 Unsteadiness on feet: Secondary | ICD-10-CM | POA: Diagnosis not present

## 2021-09-22 DIAGNOSIS — F338 Other recurrent depressive disorders: Secondary | ICD-10-CM | POA: Diagnosis not present

## 2021-09-22 DIAGNOSIS — G308 Other Alzheimer's disease: Secondary | ICD-10-CM | POA: Diagnosis not present

## 2021-09-22 DIAGNOSIS — I951 Orthostatic hypotension: Secondary | ICD-10-CM | POA: Diagnosis not present

## 2021-09-22 DIAGNOSIS — R262 Difficulty in walking, not elsewhere classified: Secondary | ICD-10-CM | POA: Diagnosis not present

## 2021-09-22 DIAGNOSIS — M6281 Muscle weakness (generalized): Secondary | ICD-10-CM | POA: Diagnosis not present

## 2021-09-22 DIAGNOSIS — R2681 Unsteadiness on feet: Secondary | ICD-10-CM | POA: Diagnosis not present

## 2021-09-23 DIAGNOSIS — I4891 Unspecified atrial fibrillation: Secondary | ICD-10-CM | POA: Diagnosis not present

## 2021-09-23 DIAGNOSIS — J449 Chronic obstructive pulmonary disease, unspecified: Secondary | ICD-10-CM | POA: Diagnosis not present

## 2021-09-23 DIAGNOSIS — I129 Hypertensive chronic kidney disease with stage 1 through stage 4 chronic kidney disease, or unspecified chronic kidney disease: Secondary | ICD-10-CM | POA: Diagnosis not present

## 2021-09-23 DIAGNOSIS — F329 Major depressive disorder, single episode, unspecified: Secondary | ICD-10-CM | POA: Diagnosis not present

## 2021-09-23 DIAGNOSIS — R296 Repeated falls: Secondary | ICD-10-CM | POA: Diagnosis not present

## 2021-09-23 DIAGNOSIS — R600 Localized edema: Secondary | ICD-10-CM | POA: Diagnosis not present

## 2021-09-23 DIAGNOSIS — E559 Vitamin D deficiency, unspecified: Secondary | ICD-10-CM | POA: Diagnosis not present

## 2021-09-23 DIAGNOSIS — N400 Enlarged prostate without lower urinary tract symptoms: Secondary | ICD-10-CM | POA: Diagnosis not present

## 2021-09-23 DIAGNOSIS — N183 Chronic kidney disease, stage 3 unspecified: Secondary | ICD-10-CM | POA: Diagnosis not present

## 2021-09-23 DIAGNOSIS — F0390 Unspecified dementia without behavioral disturbance: Secondary | ICD-10-CM | POA: Diagnosis not present

## 2021-09-24 DIAGNOSIS — M6281 Muscle weakness (generalized): Secondary | ICD-10-CM | POA: Diagnosis not present

## 2021-09-24 DIAGNOSIS — G308 Other Alzheimer's disease: Secondary | ICD-10-CM | POA: Diagnosis not present

## 2021-09-24 DIAGNOSIS — R2681 Unsteadiness on feet: Secondary | ICD-10-CM | POA: Diagnosis not present

## 2021-09-24 DIAGNOSIS — I951 Orthostatic hypotension: Secondary | ICD-10-CM | POA: Diagnosis not present

## 2021-09-24 DIAGNOSIS — R262 Difficulty in walking, not elsewhere classified: Secondary | ICD-10-CM | POA: Diagnosis not present

## 2021-09-24 DIAGNOSIS — F338 Other recurrent depressive disorders: Secondary | ICD-10-CM | POA: Diagnosis not present

## 2021-09-26 DIAGNOSIS — R262 Difficulty in walking, not elsewhere classified: Secondary | ICD-10-CM | POA: Diagnosis not present

## 2021-09-26 DIAGNOSIS — M6281 Muscle weakness (generalized): Secondary | ICD-10-CM | POA: Diagnosis not present

## 2021-09-26 DIAGNOSIS — G308 Other Alzheimer's disease: Secondary | ICD-10-CM | POA: Diagnosis not present

## 2021-09-26 DIAGNOSIS — F338 Other recurrent depressive disorders: Secondary | ICD-10-CM | POA: Diagnosis not present

## 2021-09-26 DIAGNOSIS — I951 Orthostatic hypotension: Secondary | ICD-10-CM | POA: Diagnosis not present

## 2021-09-26 DIAGNOSIS — R2681 Unsteadiness on feet: Secondary | ICD-10-CM | POA: Diagnosis not present

## 2021-09-29 DIAGNOSIS — G308 Other Alzheimer's disease: Secondary | ICD-10-CM | POA: Diagnosis not present

## 2021-09-29 DIAGNOSIS — I951 Orthostatic hypotension: Secondary | ICD-10-CM | POA: Diagnosis not present

## 2021-09-29 DIAGNOSIS — M6281 Muscle weakness (generalized): Secondary | ICD-10-CM | POA: Diagnosis not present

## 2021-09-29 DIAGNOSIS — R2681 Unsteadiness on feet: Secondary | ICD-10-CM | POA: Diagnosis not present

## 2021-09-29 DIAGNOSIS — F338 Other recurrent depressive disorders: Secondary | ICD-10-CM | POA: Diagnosis not present

## 2021-09-29 DIAGNOSIS — R262 Difficulty in walking, not elsewhere classified: Secondary | ICD-10-CM | POA: Diagnosis not present

## 2021-09-30 DIAGNOSIS — E7849 Other hyperlipidemia: Secondary | ICD-10-CM | POA: Diagnosis not present

## 2021-09-30 DIAGNOSIS — Z79899 Other long term (current) drug therapy: Secondary | ICD-10-CM | POA: Diagnosis not present

## 2021-09-30 DIAGNOSIS — E038 Other specified hypothyroidism: Secondary | ICD-10-CM | POA: Diagnosis not present

## 2021-09-30 DIAGNOSIS — D518 Other vitamin B12 deficiency anemias: Secondary | ICD-10-CM | POA: Diagnosis not present

## 2021-09-30 DIAGNOSIS — E559 Vitamin D deficiency, unspecified: Secondary | ICD-10-CM | POA: Diagnosis not present

## 2021-09-30 DIAGNOSIS — E119 Type 2 diabetes mellitus without complications: Secondary | ICD-10-CM | POA: Diagnosis not present

## 2021-10-01 DIAGNOSIS — R2681 Unsteadiness on feet: Secondary | ICD-10-CM | POA: Diagnosis not present

## 2021-10-01 DIAGNOSIS — I951 Orthostatic hypotension: Secondary | ICD-10-CM | POA: Diagnosis not present

## 2021-10-01 DIAGNOSIS — F338 Other recurrent depressive disorders: Secondary | ICD-10-CM | POA: Diagnosis not present

## 2021-10-01 DIAGNOSIS — M6281 Muscle weakness (generalized): Secondary | ICD-10-CM | POA: Diagnosis not present

## 2021-10-01 DIAGNOSIS — R262 Difficulty in walking, not elsewhere classified: Secondary | ICD-10-CM | POA: Diagnosis not present

## 2021-10-01 DIAGNOSIS — G308 Other Alzheimer's disease: Secondary | ICD-10-CM | POA: Diagnosis not present

## 2021-10-02 DIAGNOSIS — E119 Type 2 diabetes mellitus without complications: Secondary | ICD-10-CM | POA: Diagnosis not present

## 2021-10-02 DIAGNOSIS — N400 Enlarged prostate without lower urinary tract symptoms: Secondary | ICD-10-CM | POA: Diagnosis not present

## 2021-10-02 DIAGNOSIS — D518 Other vitamin B12 deficiency anemias: Secondary | ICD-10-CM | POA: Diagnosis not present

## 2021-10-02 DIAGNOSIS — F0390 Unspecified dementia without behavioral disturbance: Secondary | ICD-10-CM | POA: Diagnosis not present

## 2021-10-02 DIAGNOSIS — F419 Anxiety disorder, unspecified: Secondary | ICD-10-CM | POA: Diagnosis not present

## 2021-10-02 DIAGNOSIS — J449 Chronic obstructive pulmonary disease, unspecified: Secondary | ICD-10-CM | POA: Diagnosis not present

## 2021-10-02 DIAGNOSIS — E559 Vitamin D deficiency, unspecified: Secondary | ICD-10-CM | POA: Diagnosis not present

## 2021-10-02 DIAGNOSIS — F329 Major depressive disorder, single episode, unspecified: Secondary | ICD-10-CM | POA: Diagnosis not present

## 2021-10-02 DIAGNOSIS — I10 Essential (primary) hypertension: Secondary | ICD-10-CM | POA: Diagnosis not present

## 2021-10-02 DIAGNOSIS — I129 Hypertensive chronic kidney disease with stage 1 through stage 4 chronic kidney disease, or unspecified chronic kidney disease: Secondary | ICD-10-CM | POA: Diagnosis not present

## 2021-10-02 DIAGNOSIS — F03918 Unspecified dementia, unspecified severity, with other behavioral disturbance: Secondary | ICD-10-CM | POA: Diagnosis not present

## 2021-10-02 DIAGNOSIS — N183 Chronic kidney disease, stage 3 unspecified: Secondary | ICD-10-CM | POA: Diagnosis not present

## 2021-10-02 DIAGNOSIS — I4891 Unspecified atrial fibrillation: Secondary | ICD-10-CM | POA: Diagnosis not present

## 2021-10-02 DIAGNOSIS — E038 Other specified hypothyroidism: Secondary | ICD-10-CM | POA: Diagnosis not present

## 2021-10-02 DIAGNOSIS — F5102 Adjustment insomnia: Secondary | ICD-10-CM | POA: Diagnosis not present

## 2021-10-03 DIAGNOSIS — I951 Orthostatic hypotension: Secondary | ICD-10-CM | POA: Diagnosis not present

## 2021-10-03 DIAGNOSIS — M6281 Muscle weakness (generalized): Secondary | ICD-10-CM | POA: Diagnosis not present

## 2021-10-03 DIAGNOSIS — F338 Other recurrent depressive disorders: Secondary | ICD-10-CM | POA: Diagnosis not present

## 2021-10-03 DIAGNOSIS — R262 Difficulty in walking, not elsewhere classified: Secondary | ICD-10-CM | POA: Diagnosis not present

## 2021-10-03 DIAGNOSIS — G308 Other Alzheimer's disease: Secondary | ICD-10-CM | POA: Diagnosis not present

## 2021-10-03 DIAGNOSIS — R2681 Unsteadiness on feet: Secondary | ICD-10-CM | POA: Diagnosis not present

## 2021-10-06 DIAGNOSIS — F338 Other recurrent depressive disorders: Secondary | ICD-10-CM | POA: Diagnosis not present

## 2021-10-06 DIAGNOSIS — M6281 Muscle weakness (generalized): Secondary | ICD-10-CM | POA: Diagnosis not present

## 2021-10-06 DIAGNOSIS — G308 Other Alzheimer's disease: Secondary | ICD-10-CM | POA: Diagnosis not present

## 2021-10-06 DIAGNOSIS — I951 Orthostatic hypotension: Secondary | ICD-10-CM | POA: Diagnosis not present

## 2021-10-06 DIAGNOSIS — R2681 Unsteadiness on feet: Secondary | ICD-10-CM | POA: Diagnosis not present

## 2021-10-06 DIAGNOSIS — R262 Difficulty in walking, not elsewhere classified: Secondary | ICD-10-CM | POA: Diagnosis not present

## 2021-10-08 DIAGNOSIS — I951 Orthostatic hypotension: Secondary | ICD-10-CM | POA: Diagnosis not present

## 2021-10-08 DIAGNOSIS — R2681 Unsteadiness on feet: Secondary | ICD-10-CM | POA: Diagnosis not present

## 2021-10-08 DIAGNOSIS — R262 Difficulty in walking, not elsewhere classified: Secondary | ICD-10-CM | POA: Diagnosis not present

## 2021-10-08 DIAGNOSIS — M6281 Muscle weakness (generalized): Secondary | ICD-10-CM | POA: Diagnosis not present

## 2021-10-08 DIAGNOSIS — G308 Other Alzheimer's disease: Secondary | ICD-10-CM | POA: Diagnosis not present

## 2021-10-08 DIAGNOSIS — F338 Other recurrent depressive disorders: Secondary | ICD-10-CM | POA: Diagnosis not present

## 2021-10-10 DIAGNOSIS — F338 Other recurrent depressive disorders: Secondary | ICD-10-CM | POA: Diagnosis not present

## 2021-10-10 DIAGNOSIS — G308 Other Alzheimer's disease: Secondary | ICD-10-CM | POA: Diagnosis not present

## 2021-10-10 DIAGNOSIS — R262 Difficulty in walking, not elsewhere classified: Secondary | ICD-10-CM | POA: Diagnosis not present

## 2021-10-10 DIAGNOSIS — M6281 Muscle weakness (generalized): Secondary | ICD-10-CM | POA: Diagnosis not present

## 2021-10-10 DIAGNOSIS — I951 Orthostatic hypotension: Secondary | ICD-10-CM | POA: Diagnosis not present

## 2021-10-10 DIAGNOSIS — R2681 Unsteadiness on feet: Secondary | ICD-10-CM | POA: Diagnosis not present

## 2021-10-15 DIAGNOSIS — R262 Difficulty in walking, not elsewhere classified: Secondary | ICD-10-CM | POA: Diagnosis not present

## 2021-10-15 DIAGNOSIS — M6281 Muscle weakness (generalized): Secondary | ICD-10-CM | POA: Diagnosis not present

## 2021-10-15 DIAGNOSIS — R2681 Unsteadiness on feet: Secondary | ICD-10-CM | POA: Diagnosis not present

## 2021-10-15 DIAGNOSIS — I951 Orthostatic hypotension: Secondary | ICD-10-CM | POA: Diagnosis not present

## 2021-10-15 DIAGNOSIS — G308 Other Alzheimer's disease: Secondary | ICD-10-CM | POA: Diagnosis not present

## 2021-10-15 DIAGNOSIS — F338 Other recurrent depressive disorders: Secondary | ICD-10-CM | POA: Diagnosis not present

## 2021-10-15 DIAGNOSIS — J449 Chronic obstructive pulmonary disease, unspecified: Secondary | ICD-10-CM | POA: Diagnosis not present

## 2021-10-17 DIAGNOSIS — I951 Orthostatic hypotension: Secondary | ICD-10-CM | POA: Diagnosis not present

## 2021-10-17 DIAGNOSIS — G308 Other Alzheimer's disease: Secondary | ICD-10-CM | POA: Diagnosis not present

## 2021-10-17 DIAGNOSIS — R2681 Unsteadiness on feet: Secondary | ICD-10-CM | POA: Diagnosis not present

## 2021-10-17 DIAGNOSIS — F338 Other recurrent depressive disorders: Secondary | ICD-10-CM | POA: Diagnosis not present

## 2021-10-17 DIAGNOSIS — M6281 Muscle weakness (generalized): Secondary | ICD-10-CM | POA: Diagnosis not present

## 2021-10-17 DIAGNOSIS — R262 Difficulty in walking, not elsewhere classified: Secondary | ICD-10-CM | POA: Diagnosis not present

## 2021-10-20 DIAGNOSIS — M6281 Muscle weakness (generalized): Secondary | ICD-10-CM | POA: Diagnosis not present

## 2021-10-20 DIAGNOSIS — R2681 Unsteadiness on feet: Secondary | ICD-10-CM | POA: Diagnosis not present

## 2021-10-20 DIAGNOSIS — G308 Other Alzheimer's disease: Secondary | ICD-10-CM | POA: Diagnosis not present

## 2021-10-20 DIAGNOSIS — I951 Orthostatic hypotension: Secondary | ICD-10-CM | POA: Diagnosis not present

## 2021-10-20 DIAGNOSIS — F338 Other recurrent depressive disorders: Secondary | ICD-10-CM | POA: Diagnosis not present

## 2021-10-20 DIAGNOSIS — R262 Difficulty in walking, not elsewhere classified: Secondary | ICD-10-CM | POA: Diagnosis not present

## 2021-10-21 DIAGNOSIS — R296 Repeated falls: Secondary | ICD-10-CM | POA: Diagnosis not present

## 2021-10-21 DIAGNOSIS — I129 Hypertensive chronic kidney disease with stage 1 through stage 4 chronic kidney disease, or unspecified chronic kidney disease: Secondary | ICD-10-CM | POA: Diagnosis not present

## 2021-10-21 DIAGNOSIS — J449 Chronic obstructive pulmonary disease, unspecified: Secondary | ICD-10-CM | POA: Diagnosis not present

## 2021-10-21 DIAGNOSIS — I10 Essential (primary) hypertension: Secondary | ICD-10-CM | POA: Diagnosis not present

## 2021-10-21 DIAGNOSIS — F0390 Unspecified dementia without behavioral disturbance: Secondary | ICD-10-CM | POA: Diagnosis not present

## 2021-10-21 DIAGNOSIS — F329 Major depressive disorder, single episode, unspecified: Secondary | ICD-10-CM | POA: Diagnosis not present

## 2021-10-21 DIAGNOSIS — N400 Enlarged prostate without lower urinary tract symptoms: Secondary | ICD-10-CM | POA: Diagnosis not present

## 2021-10-21 DIAGNOSIS — E559 Vitamin D deficiency, unspecified: Secondary | ICD-10-CM | POA: Diagnosis not present

## 2021-10-22 DIAGNOSIS — R2681 Unsteadiness on feet: Secondary | ICD-10-CM | POA: Diagnosis not present

## 2021-10-22 DIAGNOSIS — G308 Other Alzheimer's disease: Secondary | ICD-10-CM | POA: Diagnosis not present

## 2021-10-22 DIAGNOSIS — M6281 Muscle weakness (generalized): Secondary | ICD-10-CM | POA: Diagnosis not present

## 2021-10-22 DIAGNOSIS — R262 Difficulty in walking, not elsewhere classified: Secondary | ICD-10-CM | POA: Diagnosis not present

## 2021-10-22 DIAGNOSIS — I951 Orthostatic hypotension: Secondary | ICD-10-CM | POA: Diagnosis not present

## 2021-10-22 DIAGNOSIS — F338 Other recurrent depressive disorders: Secondary | ICD-10-CM | POA: Diagnosis not present

## 2021-10-24 DIAGNOSIS — F338 Other recurrent depressive disorders: Secondary | ICD-10-CM | POA: Diagnosis not present

## 2021-10-24 DIAGNOSIS — M6281 Muscle weakness (generalized): Secondary | ICD-10-CM | POA: Diagnosis not present

## 2021-10-24 DIAGNOSIS — R262 Difficulty in walking, not elsewhere classified: Secondary | ICD-10-CM | POA: Diagnosis not present

## 2021-10-24 DIAGNOSIS — G308 Other Alzheimer's disease: Secondary | ICD-10-CM | POA: Diagnosis not present

## 2021-10-24 DIAGNOSIS — R2681 Unsteadiness on feet: Secondary | ICD-10-CM | POA: Diagnosis not present

## 2021-10-24 DIAGNOSIS — I951 Orthostatic hypotension: Secondary | ICD-10-CM | POA: Diagnosis not present

## 2021-10-27 DIAGNOSIS — L84 Corns and callosities: Secondary | ICD-10-CM | POA: Diagnosis not present

## 2021-10-27 DIAGNOSIS — G308 Other Alzheimer's disease: Secondary | ICD-10-CM | POA: Diagnosis not present

## 2021-10-27 DIAGNOSIS — I951 Orthostatic hypotension: Secondary | ICD-10-CM | POA: Diagnosis not present

## 2021-10-27 DIAGNOSIS — R600 Localized edema: Secondary | ICD-10-CM | POA: Diagnosis not present

## 2021-10-27 DIAGNOSIS — N183 Chronic kidney disease, stage 3 unspecified: Secondary | ICD-10-CM | POA: Diagnosis not present

## 2021-10-27 DIAGNOSIS — M6281 Muscle weakness (generalized): Secondary | ICD-10-CM | POA: Diagnosis not present

## 2021-10-27 DIAGNOSIS — I7091 Generalized atherosclerosis: Secondary | ICD-10-CM | POA: Diagnosis not present

## 2021-10-27 DIAGNOSIS — B351 Tinea unguium: Secondary | ICD-10-CM | POA: Diagnosis not present

## 2021-10-27 DIAGNOSIS — F338 Other recurrent depressive disorders: Secondary | ICD-10-CM | POA: Diagnosis not present

## 2021-10-27 DIAGNOSIS — R2681 Unsteadiness on feet: Secondary | ICD-10-CM | POA: Diagnosis not present

## 2021-10-27 DIAGNOSIS — L603 Nail dystrophy: Secondary | ICD-10-CM | POA: Diagnosis not present

## 2021-10-27 DIAGNOSIS — R262 Difficulty in walking, not elsewhere classified: Secondary | ICD-10-CM | POA: Diagnosis not present

## 2021-10-29 DIAGNOSIS — R262 Difficulty in walking, not elsewhere classified: Secondary | ICD-10-CM | POA: Diagnosis not present

## 2021-10-29 DIAGNOSIS — R2681 Unsteadiness on feet: Secondary | ICD-10-CM | POA: Diagnosis not present

## 2021-10-29 DIAGNOSIS — M6281 Muscle weakness (generalized): Secondary | ICD-10-CM | POA: Diagnosis not present

## 2021-10-29 DIAGNOSIS — F338 Other recurrent depressive disorders: Secondary | ICD-10-CM | POA: Diagnosis not present

## 2021-10-29 DIAGNOSIS — I951 Orthostatic hypotension: Secondary | ICD-10-CM | POA: Diagnosis not present

## 2021-10-29 DIAGNOSIS — G308 Other Alzheimer's disease: Secondary | ICD-10-CM | POA: Diagnosis not present

## 2021-10-30 DIAGNOSIS — F419 Anxiety disorder, unspecified: Secondary | ICD-10-CM | POA: Diagnosis not present

## 2021-10-30 DIAGNOSIS — F5102 Adjustment insomnia: Secondary | ICD-10-CM | POA: Diagnosis not present

## 2021-10-30 DIAGNOSIS — F03918 Unspecified dementia, unspecified severity, with other behavioral disturbance: Secondary | ICD-10-CM | POA: Diagnosis not present

## 2021-10-31 DIAGNOSIS — I951 Orthostatic hypotension: Secondary | ICD-10-CM | POA: Diagnosis not present

## 2021-10-31 DIAGNOSIS — F338 Other recurrent depressive disorders: Secondary | ICD-10-CM | POA: Diagnosis not present

## 2021-10-31 DIAGNOSIS — M6281 Muscle weakness (generalized): Secondary | ICD-10-CM | POA: Diagnosis not present

## 2021-10-31 DIAGNOSIS — R262 Difficulty in walking, not elsewhere classified: Secondary | ICD-10-CM | POA: Diagnosis not present

## 2021-10-31 DIAGNOSIS — G308 Other Alzheimer's disease: Secondary | ICD-10-CM | POA: Diagnosis not present

## 2021-10-31 DIAGNOSIS — R2681 Unsteadiness on feet: Secondary | ICD-10-CM | POA: Diagnosis not present

## 2021-11-03 DIAGNOSIS — F338 Other recurrent depressive disorders: Secondary | ICD-10-CM | POA: Diagnosis not present

## 2021-11-03 DIAGNOSIS — I951 Orthostatic hypotension: Secondary | ICD-10-CM | POA: Diagnosis not present

## 2021-11-03 DIAGNOSIS — R262 Difficulty in walking, not elsewhere classified: Secondary | ICD-10-CM | POA: Diagnosis not present

## 2021-11-03 DIAGNOSIS — M6281 Muscle weakness (generalized): Secondary | ICD-10-CM | POA: Diagnosis not present

## 2021-11-03 DIAGNOSIS — R2681 Unsteadiness on feet: Secondary | ICD-10-CM | POA: Diagnosis not present

## 2021-11-03 DIAGNOSIS — G308 Other Alzheimer's disease: Secondary | ICD-10-CM | POA: Diagnosis not present

## 2021-11-05 DIAGNOSIS — G308 Other Alzheimer's disease: Secondary | ICD-10-CM | POA: Diagnosis not present

## 2021-11-05 DIAGNOSIS — F338 Other recurrent depressive disorders: Secondary | ICD-10-CM | POA: Diagnosis not present

## 2021-11-05 DIAGNOSIS — R262 Difficulty in walking, not elsewhere classified: Secondary | ICD-10-CM | POA: Diagnosis not present

## 2021-11-05 DIAGNOSIS — M6281 Muscle weakness (generalized): Secondary | ICD-10-CM | POA: Diagnosis not present

## 2021-11-05 DIAGNOSIS — R2681 Unsteadiness on feet: Secondary | ICD-10-CM | POA: Diagnosis not present

## 2021-11-05 DIAGNOSIS — I951 Orthostatic hypotension: Secondary | ICD-10-CM | POA: Diagnosis not present

## 2021-11-07 DIAGNOSIS — I951 Orthostatic hypotension: Secondary | ICD-10-CM | POA: Diagnosis not present

## 2021-11-07 DIAGNOSIS — G308 Other Alzheimer's disease: Secondary | ICD-10-CM | POA: Diagnosis not present

## 2021-11-07 DIAGNOSIS — R262 Difficulty in walking, not elsewhere classified: Secondary | ICD-10-CM | POA: Diagnosis not present

## 2021-11-07 DIAGNOSIS — M6281 Muscle weakness (generalized): Secondary | ICD-10-CM | POA: Diagnosis not present

## 2021-11-07 DIAGNOSIS — R2681 Unsteadiness on feet: Secondary | ICD-10-CM | POA: Diagnosis not present

## 2021-11-07 DIAGNOSIS — F338 Other recurrent depressive disorders: Secondary | ICD-10-CM | POA: Diagnosis not present

## 2021-11-10 DIAGNOSIS — R262 Difficulty in walking, not elsewhere classified: Secondary | ICD-10-CM | POA: Diagnosis not present

## 2021-11-10 DIAGNOSIS — I951 Orthostatic hypotension: Secondary | ICD-10-CM | POA: Diagnosis not present

## 2021-11-10 DIAGNOSIS — F338 Other recurrent depressive disorders: Secondary | ICD-10-CM | POA: Diagnosis not present

## 2021-11-10 DIAGNOSIS — M6281 Muscle weakness (generalized): Secondary | ICD-10-CM | POA: Diagnosis not present

## 2021-11-10 DIAGNOSIS — R2681 Unsteadiness on feet: Secondary | ICD-10-CM | POA: Diagnosis not present

## 2021-11-10 DIAGNOSIS — G308 Other Alzheimer's disease: Secondary | ICD-10-CM | POA: Diagnosis not present

## 2021-11-11 DIAGNOSIS — E038 Other specified hypothyroidism: Secondary | ICD-10-CM | POA: Diagnosis not present

## 2021-11-11 DIAGNOSIS — E559 Vitamin D deficiency, unspecified: Secondary | ICD-10-CM | POA: Diagnosis not present

## 2021-11-11 DIAGNOSIS — F329 Major depressive disorder, single episode, unspecified: Secondary | ICD-10-CM | POA: Diagnosis not present

## 2021-11-11 DIAGNOSIS — F0390 Unspecified dementia without behavioral disturbance: Secondary | ICD-10-CM | POA: Diagnosis not present

## 2021-11-11 DIAGNOSIS — I4891 Unspecified atrial fibrillation: Secondary | ICD-10-CM | POA: Diagnosis not present

## 2021-11-11 DIAGNOSIS — N400 Enlarged prostate without lower urinary tract symptoms: Secondary | ICD-10-CM | POA: Diagnosis not present

## 2021-11-11 DIAGNOSIS — I129 Hypertensive chronic kidney disease with stage 1 through stage 4 chronic kidney disease, or unspecified chronic kidney disease: Secondary | ICD-10-CM | POA: Diagnosis not present

## 2021-11-11 DIAGNOSIS — N183 Chronic kidney disease, stage 3 unspecified: Secondary | ICD-10-CM | POA: Diagnosis not present

## 2021-11-11 DIAGNOSIS — D518 Other vitamin B12 deficiency anemias: Secondary | ICD-10-CM | POA: Diagnosis not present

## 2021-11-11 DIAGNOSIS — J449 Chronic obstructive pulmonary disease, unspecified: Secondary | ICD-10-CM | POA: Diagnosis not present

## 2021-11-11 DIAGNOSIS — E119 Type 2 diabetes mellitus without complications: Secondary | ICD-10-CM | POA: Diagnosis not present

## 2021-11-11 DIAGNOSIS — I10 Essential (primary) hypertension: Secondary | ICD-10-CM | POA: Diagnosis not present

## 2021-11-12 DIAGNOSIS — M6281 Muscle weakness (generalized): Secondary | ICD-10-CM | POA: Diagnosis not present

## 2021-11-12 DIAGNOSIS — F338 Other recurrent depressive disorders: Secondary | ICD-10-CM | POA: Diagnosis not present

## 2021-11-12 DIAGNOSIS — R2681 Unsteadiness on feet: Secondary | ICD-10-CM | POA: Diagnosis not present

## 2021-11-12 DIAGNOSIS — I951 Orthostatic hypotension: Secondary | ICD-10-CM | POA: Diagnosis not present

## 2021-11-12 DIAGNOSIS — G308 Other Alzheimer's disease: Secondary | ICD-10-CM | POA: Diagnosis not present

## 2021-11-12 DIAGNOSIS — R262 Difficulty in walking, not elsewhere classified: Secondary | ICD-10-CM | POA: Diagnosis not present

## 2021-11-14 DIAGNOSIS — G308 Other Alzheimer's disease: Secondary | ICD-10-CM | POA: Diagnosis not present

## 2021-11-14 DIAGNOSIS — F338 Other recurrent depressive disorders: Secondary | ICD-10-CM | POA: Diagnosis not present

## 2021-11-14 DIAGNOSIS — M6281 Muscle weakness (generalized): Secondary | ICD-10-CM | POA: Diagnosis not present

## 2021-11-14 DIAGNOSIS — I951 Orthostatic hypotension: Secondary | ICD-10-CM | POA: Diagnosis not present

## 2021-11-14 DIAGNOSIS — R2681 Unsteadiness on feet: Secondary | ICD-10-CM | POA: Diagnosis not present

## 2021-11-14 DIAGNOSIS — R262 Difficulty in walking, not elsewhere classified: Secondary | ICD-10-CM | POA: Diagnosis not present

## 2021-11-17 DIAGNOSIS — F338 Other recurrent depressive disorders: Secondary | ICD-10-CM | POA: Diagnosis not present

## 2021-11-17 DIAGNOSIS — R262 Difficulty in walking, not elsewhere classified: Secondary | ICD-10-CM | POA: Diagnosis not present

## 2021-11-17 DIAGNOSIS — I951 Orthostatic hypotension: Secondary | ICD-10-CM | POA: Diagnosis not present

## 2021-11-17 DIAGNOSIS — M6281 Muscle weakness (generalized): Secondary | ICD-10-CM | POA: Diagnosis not present

## 2021-11-17 DIAGNOSIS — G308 Other Alzheimer's disease: Secondary | ICD-10-CM | POA: Diagnosis not present

## 2021-11-17 DIAGNOSIS — R2681 Unsteadiness on feet: Secondary | ICD-10-CM | POA: Diagnosis not present

## 2021-11-18 DIAGNOSIS — I10 Essential (primary) hypertension: Secondary | ICD-10-CM | POA: Diagnosis not present

## 2021-11-18 DIAGNOSIS — N183 Chronic kidney disease, stage 3 unspecified: Secondary | ICD-10-CM | POA: Diagnosis not present

## 2021-11-18 DIAGNOSIS — I4891 Unspecified atrial fibrillation: Secondary | ICD-10-CM | POA: Diagnosis not present

## 2021-11-18 DIAGNOSIS — R600 Localized edema: Secondary | ICD-10-CM | POA: Diagnosis not present

## 2021-11-18 DIAGNOSIS — F329 Major depressive disorder, single episode, unspecified: Secondary | ICD-10-CM | POA: Diagnosis not present

## 2021-11-18 DIAGNOSIS — N400 Enlarged prostate without lower urinary tract symptoms: Secondary | ICD-10-CM | POA: Diagnosis not present

## 2021-11-18 DIAGNOSIS — I129 Hypertensive chronic kidney disease with stage 1 through stage 4 chronic kidney disease, or unspecified chronic kidney disease: Secondary | ICD-10-CM | POA: Diagnosis not present

## 2021-11-18 DIAGNOSIS — F0390 Unspecified dementia without behavioral disturbance: Secondary | ICD-10-CM | POA: Diagnosis not present

## 2021-11-18 DIAGNOSIS — J449 Chronic obstructive pulmonary disease, unspecified: Secondary | ICD-10-CM | POA: Diagnosis not present

## 2021-11-18 DIAGNOSIS — E559 Vitamin D deficiency, unspecified: Secondary | ICD-10-CM | POA: Diagnosis not present

## 2021-11-19 DIAGNOSIS — I10 Essential (primary) hypertension: Secondary | ICD-10-CM | POA: Diagnosis not present

## 2021-11-21 DIAGNOSIS — G308 Other Alzheimer's disease: Secondary | ICD-10-CM | POA: Diagnosis not present

## 2021-11-21 DIAGNOSIS — R262 Difficulty in walking, not elsewhere classified: Secondary | ICD-10-CM | POA: Diagnosis not present

## 2021-11-21 DIAGNOSIS — R2681 Unsteadiness on feet: Secondary | ICD-10-CM | POA: Diagnosis not present

## 2021-11-21 DIAGNOSIS — I951 Orthostatic hypotension: Secondary | ICD-10-CM | POA: Diagnosis not present

## 2021-11-21 DIAGNOSIS — F338 Other recurrent depressive disorders: Secondary | ICD-10-CM | POA: Diagnosis not present

## 2021-11-21 DIAGNOSIS — M6281 Muscle weakness (generalized): Secondary | ICD-10-CM | POA: Diagnosis not present

## 2021-11-28 DIAGNOSIS — I951 Orthostatic hypotension: Secondary | ICD-10-CM | POA: Diagnosis not present

## 2021-11-28 DIAGNOSIS — F338 Other recurrent depressive disorders: Secondary | ICD-10-CM | POA: Diagnosis not present

## 2021-11-28 DIAGNOSIS — M6281 Muscle weakness (generalized): Secondary | ICD-10-CM | POA: Diagnosis not present

## 2021-11-28 DIAGNOSIS — R2681 Unsteadiness on feet: Secondary | ICD-10-CM | POA: Diagnosis not present

## 2021-11-28 DIAGNOSIS — G308 Other Alzheimer's disease: Secondary | ICD-10-CM | POA: Diagnosis not present

## 2021-11-28 DIAGNOSIS — R262 Difficulty in walking, not elsewhere classified: Secondary | ICD-10-CM | POA: Diagnosis not present

## 2021-11-29 DIAGNOSIS — E559 Vitamin D deficiency, unspecified: Secondary | ICD-10-CM | POA: Diagnosis not present

## 2021-11-29 DIAGNOSIS — I4891 Unspecified atrial fibrillation: Secondary | ICD-10-CM | POA: Diagnosis not present

## 2021-11-29 DIAGNOSIS — E119 Type 2 diabetes mellitus without complications: Secondary | ICD-10-CM | POA: Diagnosis not present

## 2021-11-29 DIAGNOSIS — I10 Essential (primary) hypertension: Secondary | ICD-10-CM | POA: Diagnosis not present

## 2021-11-29 DIAGNOSIS — F0390 Unspecified dementia without behavioral disturbance: Secondary | ICD-10-CM | POA: Diagnosis not present

## 2021-11-29 DIAGNOSIS — N183 Chronic kidney disease, stage 3 unspecified: Secondary | ICD-10-CM | POA: Diagnosis not present

## 2021-11-29 DIAGNOSIS — I129 Hypertensive chronic kidney disease with stage 1 through stage 4 chronic kidney disease, or unspecified chronic kidney disease: Secondary | ICD-10-CM | POA: Diagnosis not present

## 2021-11-29 DIAGNOSIS — D518 Other vitamin B12 deficiency anemias: Secondary | ICD-10-CM | POA: Diagnosis not present

## 2021-11-29 DIAGNOSIS — F329 Major depressive disorder, single episode, unspecified: Secondary | ICD-10-CM | POA: Diagnosis not present

## 2021-11-29 DIAGNOSIS — N400 Enlarged prostate without lower urinary tract symptoms: Secondary | ICD-10-CM | POA: Diagnosis not present

## 2021-11-29 DIAGNOSIS — E038 Other specified hypothyroidism: Secondary | ICD-10-CM | POA: Diagnosis not present

## 2021-11-29 DIAGNOSIS — J449 Chronic obstructive pulmonary disease, unspecified: Secondary | ICD-10-CM | POA: Diagnosis not present

## 2021-12-03 DIAGNOSIS — I951 Orthostatic hypotension: Secondary | ICD-10-CM | POA: Diagnosis not present

## 2021-12-03 DIAGNOSIS — R2681 Unsteadiness on feet: Secondary | ICD-10-CM | POA: Diagnosis not present

## 2021-12-03 DIAGNOSIS — R262 Difficulty in walking, not elsewhere classified: Secondary | ICD-10-CM | POA: Diagnosis not present

## 2021-12-03 DIAGNOSIS — M6281 Muscle weakness (generalized): Secondary | ICD-10-CM | POA: Diagnosis not present

## 2021-12-03 DIAGNOSIS — G308 Other Alzheimer's disease: Secondary | ICD-10-CM | POA: Diagnosis not present

## 2021-12-03 DIAGNOSIS — F338 Other recurrent depressive disorders: Secondary | ICD-10-CM | POA: Diagnosis not present

## 2021-12-05 DIAGNOSIS — R2681 Unsteadiness on feet: Secondary | ICD-10-CM | POA: Diagnosis not present

## 2021-12-05 DIAGNOSIS — R262 Difficulty in walking, not elsewhere classified: Secondary | ICD-10-CM | POA: Diagnosis not present

## 2021-12-05 DIAGNOSIS — M6281 Muscle weakness (generalized): Secondary | ICD-10-CM | POA: Diagnosis not present

## 2021-12-05 DIAGNOSIS — F03911 Unspecified dementia, unspecified severity, with agitation: Secondary | ICD-10-CM | POA: Diagnosis not present

## 2021-12-05 DIAGNOSIS — Z515 Encounter for palliative care: Secondary | ICD-10-CM | POA: Diagnosis not present

## 2021-12-05 DIAGNOSIS — F338 Other recurrent depressive disorders: Secondary | ICD-10-CM | POA: Diagnosis not present

## 2021-12-05 DIAGNOSIS — G308 Other Alzheimer's disease: Secondary | ICD-10-CM | POA: Diagnosis not present

## 2021-12-05 DIAGNOSIS — I951 Orthostatic hypotension: Secondary | ICD-10-CM | POA: Diagnosis not present

## 2021-12-05 DIAGNOSIS — Z9181 History of falling: Secondary | ICD-10-CM | POA: Diagnosis not present

## 2021-12-11 DIAGNOSIS — F5102 Adjustment insomnia: Secondary | ICD-10-CM | POA: Diagnosis not present

## 2021-12-11 DIAGNOSIS — F419 Anxiety disorder, unspecified: Secondary | ICD-10-CM | POA: Diagnosis not present

## 2021-12-11 DIAGNOSIS — F03918 Unspecified dementia, unspecified severity, with other behavioral disturbance: Secondary | ICD-10-CM | POA: Diagnosis not present

## 2021-12-12 DIAGNOSIS — G308 Other Alzheimer's disease: Secondary | ICD-10-CM | POA: Diagnosis not present

## 2021-12-12 DIAGNOSIS — M6281 Muscle weakness (generalized): Secondary | ICD-10-CM | POA: Diagnosis not present

## 2021-12-12 DIAGNOSIS — R2681 Unsteadiness on feet: Secondary | ICD-10-CM | POA: Diagnosis not present

## 2021-12-12 DIAGNOSIS — F338 Other recurrent depressive disorders: Secondary | ICD-10-CM | POA: Diagnosis not present

## 2021-12-12 DIAGNOSIS — I951 Orthostatic hypotension: Secondary | ICD-10-CM | POA: Diagnosis not present

## 2021-12-12 DIAGNOSIS — R262 Difficulty in walking, not elsewhere classified: Secondary | ICD-10-CM | POA: Diagnosis not present

## 2021-12-16 DIAGNOSIS — N183 Chronic kidney disease, stage 3 unspecified: Secondary | ICD-10-CM | POA: Diagnosis not present

## 2021-12-16 DIAGNOSIS — R600 Localized edema: Secondary | ICD-10-CM | POA: Diagnosis not present

## 2021-12-16 DIAGNOSIS — I4891 Unspecified atrial fibrillation: Secondary | ICD-10-CM | POA: Diagnosis not present

## 2021-12-16 DIAGNOSIS — N400 Enlarged prostate without lower urinary tract symptoms: Secondary | ICD-10-CM | POA: Diagnosis not present

## 2021-12-16 DIAGNOSIS — F329 Major depressive disorder, single episode, unspecified: Secondary | ICD-10-CM | POA: Diagnosis not present

## 2021-12-16 DIAGNOSIS — R451 Restlessness and agitation: Secondary | ICD-10-CM | POA: Diagnosis not present

## 2021-12-16 DIAGNOSIS — J449 Chronic obstructive pulmonary disease, unspecified: Secondary | ICD-10-CM | POA: Diagnosis not present

## 2021-12-16 DIAGNOSIS — I129 Hypertensive chronic kidney disease with stage 1 through stage 4 chronic kidney disease, or unspecified chronic kidney disease: Secondary | ICD-10-CM | POA: Diagnosis not present

## 2021-12-16 DIAGNOSIS — E559 Vitamin D deficiency, unspecified: Secondary | ICD-10-CM | POA: Diagnosis not present

## 2021-12-17 DIAGNOSIS — I10 Essential (primary) hypertension: Secondary | ICD-10-CM | POA: Diagnosis not present

## 2021-12-17 DIAGNOSIS — R262 Difficulty in walking, not elsewhere classified: Secondary | ICD-10-CM | POA: Diagnosis not present

## 2021-12-17 DIAGNOSIS — F338 Other recurrent depressive disorders: Secondary | ICD-10-CM | POA: Diagnosis not present

## 2021-12-17 DIAGNOSIS — M6281 Muscle weakness (generalized): Secondary | ICD-10-CM | POA: Diagnosis not present

## 2021-12-17 DIAGNOSIS — R2681 Unsteadiness on feet: Secondary | ICD-10-CM | POA: Diagnosis not present

## 2021-12-17 DIAGNOSIS — G308 Other Alzheimer's disease: Secondary | ICD-10-CM | POA: Diagnosis not present

## 2021-12-17 DIAGNOSIS — I951 Orthostatic hypotension: Secondary | ICD-10-CM | POA: Diagnosis not present

## 2021-12-19 DIAGNOSIS — F338 Other recurrent depressive disorders: Secondary | ICD-10-CM | POA: Diagnosis not present

## 2021-12-19 DIAGNOSIS — I951 Orthostatic hypotension: Secondary | ICD-10-CM | POA: Diagnosis not present

## 2021-12-19 DIAGNOSIS — R2681 Unsteadiness on feet: Secondary | ICD-10-CM | POA: Diagnosis not present

## 2021-12-19 DIAGNOSIS — G308 Other Alzheimer's disease: Secondary | ICD-10-CM | POA: Diagnosis not present

## 2021-12-19 DIAGNOSIS — M6281 Muscle weakness (generalized): Secondary | ICD-10-CM | POA: Diagnosis not present

## 2021-12-19 DIAGNOSIS — R262 Difficulty in walking, not elsewhere classified: Secondary | ICD-10-CM | POA: Diagnosis not present

## 2021-12-22 DIAGNOSIS — G308 Other Alzheimer's disease: Secondary | ICD-10-CM | POA: Diagnosis not present

## 2021-12-22 DIAGNOSIS — R2681 Unsteadiness on feet: Secondary | ICD-10-CM | POA: Diagnosis not present

## 2021-12-22 DIAGNOSIS — I951 Orthostatic hypotension: Secondary | ICD-10-CM | POA: Diagnosis not present

## 2021-12-22 DIAGNOSIS — F338 Other recurrent depressive disorders: Secondary | ICD-10-CM | POA: Diagnosis not present

## 2021-12-22 DIAGNOSIS — M6281 Muscle weakness (generalized): Secondary | ICD-10-CM | POA: Diagnosis not present

## 2021-12-22 DIAGNOSIS — R262 Difficulty in walking, not elsewhere classified: Secondary | ICD-10-CM | POA: Diagnosis not present

## 2021-12-23 DIAGNOSIS — W19XXXA Unspecified fall, initial encounter: Secondary | ICD-10-CM | POA: Diagnosis not present

## 2021-12-23 DIAGNOSIS — Z7901 Long term (current) use of anticoagulants: Secondary | ICD-10-CM | POA: Diagnosis not present

## 2021-12-23 DIAGNOSIS — S0181XA Laceration without foreign body of other part of head, initial encounter: Secondary | ICD-10-CM | POA: Diagnosis not present

## 2021-12-23 DIAGNOSIS — S0990XA Unspecified injury of head, initial encounter: Secondary | ICD-10-CM | POA: Diagnosis not present

## 2021-12-23 DIAGNOSIS — I6789 Other cerebrovascular disease: Secondary | ICD-10-CM | POA: Diagnosis not present

## 2021-12-23 DIAGNOSIS — R404 Transient alteration of awareness: Secondary | ICD-10-CM | POA: Diagnosis not present

## 2021-12-23 DIAGNOSIS — Z743 Need for continuous supervision: Secondary | ICD-10-CM | POA: Diagnosis not present

## 2021-12-23 DIAGNOSIS — R102 Pelvic and perineal pain: Secondary | ICD-10-CM | POA: Diagnosis not present

## 2021-12-23 DIAGNOSIS — R9082 White matter disease, unspecified: Secondary | ICD-10-CM | POA: Diagnosis not present

## 2021-12-23 DIAGNOSIS — M47812 Spondylosis without myelopathy or radiculopathy, cervical region: Secondary | ICD-10-CM | POA: Diagnosis not present

## 2021-12-23 DIAGNOSIS — M25552 Pain in left hip: Secondary | ICD-10-CM | POA: Diagnosis not present

## 2021-12-23 DIAGNOSIS — S0993XA Unspecified injury of face, initial encounter: Secondary | ICD-10-CM | POA: Diagnosis not present

## 2021-12-23 DIAGNOSIS — S01112A Laceration without foreign body of left eyelid and periocular area, initial encounter: Secondary | ICD-10-CM | POA: Diagnosis not present

## 2021-12-23 DIAGNOSIS — S199XXA Unspecified injury of neck, initial encounter: Secondary | ICD-10-CM | POA: Diagnosis not present

## 2021-12-23 DIAGNOSIS — R296 Repeated falls: Secondary | ICD-10-CM | POA: Diagnosis not present

## 2021-12-23 DIAGNOSIS — W1839XA Other fall on same level, initial encounter: Secondary | ICD-10-CM | POA: Diagnosis not present

## 2021-12-24 DIAGNOSIS — M6281 Muscle weakness (generalized): Secondary | ICD-10-CM | POA: Diagnosis not present

## 2021-12-24 DIAGNOSIS — R262 Difficulty in walking, not elsewhere classified: Secondary | ICD-10-CM | POA: Diagnosis not present

## 2021-12-24 DIAGNOSIS — G308 Other Alzheimer's disease: Secondary | ICD-10-CM | POA: Diagnosis not present

## 2021-12-24 DIAGNOSIS — R2681 Unsteadiness on feet: Secondary | ICD-10-CM | POA: Diagnosis not present

## 2021-12-24 DIAGNOSIS — F338 Other recurrent depressive disorders: Secondary | ICD-10-CM | POA: Diagnosis not present

## 2021-12-24 DIAGNOSIS — I951 Orthostatic hypotension: Secondary | ICD-10-CM | POA: Diagnosis not present

## 2021-12-25 DIAGNOSIS — R296 Repeated falls: Secondary | ICD-10-CM | POA: Diagnosis not present

## 2021-12-25 DIAGNOSIS — S0990XS Unspecified injury of head, sequela: Secondary | ICD-10-CM | POA: Diagnosis not present

## 2022-01-02 DIAGNOSIS — F338 Other recurrent depressive disorders: Secondary | ICD-10-CM | POA: Diagnosis not present

## 2022-01-02 DIAGNOSIS — I951 Orthostatic hypotension: Secondary | ICD-10-CM | POA: Diagnosis not present

## 2022-01-02 DIAGNOSIS — G308 Other Alzheimer's disease: Secondary | ICD-10-CM | POA: Diagnosis not present

## 2022-01-02 DIAGNOSIS — R2681 Unsteadiness on feet: Secondary | ICD-10-CM | POA: Diagnosis not present

## 2022-01-02 DIAGNOSIS — M6281 Muscle weakness (generalized): Secondary | ICD-10-CM | POA: Diagnosis not present

## 2022-01-02 DIAGNOSIS — R262 Difficulty in walking, not elsewhere classified: Secondary | ICD-10-CM | POA: Diagnosis not present

## 2022-01-04 DIAGNOSIS — S0993XA Unspecified injury of face, initial encounter: Secondary | ICD-10-CM | POA: Diagnosis not present

## 2022-01-04 DIAGNOSIS — I959 Hypotension, unspecified: Secondary | ICD-10-CM | POA: Diagnosis not present

## 2022-01-04 DIAGNOSIS — W19XXXA Unspecified fall, initial encounter: Secondary | ICD-10-CM | POA: Diagnosis not present

## 2022-01-05 DIAGNOSIS — R262 Difficulty in walking, not elsewhere classified: Secondary | ICD-10-CM | POA: Diagnosis not present

## 2022-01-05 DIAGNOSIS — U071 COVID-19: Secondary | ICD-10-CM | POA: Diagnosis not present

## 2022-01-05 DIAGNOSIS — F338 Other recurrent depressive disorders: Secondary | ICD-10-CM | POA: Diagnosis not present

## 2022-01-05 DIAGNOSIS — M6281 Muscle weakness (generalized): Secondary | ICD-10-CM | POA: Diagnosis not present

## 2022-01-05 DIAGNOSIS — S0181XA Laceration without foreign body of other part of head, initial encounter: Secondary | ICD-10-CM | POA: Diagnosis not present

## 2022-01-05 DIAGNOSIS — R0902 Hypoxemia: Secondary | ICD-10-CM | POA: Diagnosis not present

## 2022-01-05 DIAGNOSIS — Z20828 Contact with and (suspected) exposure to other viral communicable diseases: Secondary | ICD-10-CM | POA: Diagnosis not present

## 2022-01-05 DIAGNOSIS — R079 Chest pain, unspecified: Secondary | ICD-10-CM | POA: Diagnosis not present

## 2022-01-05 DIAGNOSIS — G308 Other Alzheimer's disease: Secondary | ICD-10-CM | POA: Diagnosis not present

## 2022-01-05 DIAGNOSIS — G319 Degenerative disease of nervous system, unspecified: Secondary | ICD-10-CM | POA: Diagnosis not present

## 2022-01-05 DIAGNOSIS — E860 Dehydration: Secondary | ICD-10-CM | POA: Diagnosis not present

## 2022-01-05 DIAGNOSIS — I517 Cardiomegaly: Secondary | ICD-10-CM | POA: Diagnosis not present

## 2022-01-05 DIAGNOSIS — R296 Repeated falls: Secondary | ICD-10-CM | POA: Diagnosis not present

## 2022-01-05 DIAGNOSIS — Z7401 Bed confinement status: Secondary | ICD-10-CM | POA: Diagnosis not present

## 2022-01-05 DIAGNOSIS — M2578 Osteophyte, vertebrae: Secondary | ICD-10-CM | POA: Diagnosis not present

## 2022-01-05 DIAGNOSIS — N183 Chronic kidney disease, stage 3 unspecified: Secondary | ICD-10-CM | POA: Diagnosis not present

## 2022-01-05 DIAGNOSIS — R2681 Unsteadiness on feet: Secondary | ICD-10-CM | POA: Diagnosis not present

## 2022-01-05 DIAGNOSIS — W1839XA Other fall on same level, initial encounter: Secondary | ICD-10-CM | POA: Diagnosis not present

## 2022-01-05 DIAGNOSIS — R279 Unspecified lack of coordination: Secondary | ICD-10-CM | POA: Diagnosis not present

## 2022-01-05 DIAGNOSIS — I951 Orthostatic hypotension: Secondary | ICD-10-CM | POA: Diagnosis not present

## 2022-01-05 DIAGNOSIS — Z743 Need for continuous supervision: Secondary | ICD-10-CM | POA: Diagnosis not present

## 2022-01-05 DIAGNOSIS — R220 Localized swelling, mass and lump, head: Secondary | ICD-10-CM | POA: Diagnosis not present

## 2022-01-09 DIAGNOSIS — F5102 Adjustment insomnia: Secondary | ICD-10-CM | POA: Diagnosis not present

## 2022-01-09 DIAGNOSIS — F419 Anxiety disorder, unspecified: Secondary | ICD-10-CM | POA: Diagnosis not present

## 2022-01-09 DIAGNOSIS — F03918 Unspecified dementia, unspecified severity, with other behavioral disturbance: Secondary | ICD-10-CM | POA: Diagnosis not present

## 2022-01-12 DIAGNOSIS — W19XXXD Unspecified fall, subsequent encounter: Secondary | ICD-10-CM | POA: Diagnosis not present

## 2022-01-12 DIAGNOSIS — Z515 Encounter for palliative care: Secondary | ICD-10-CM | POA: Diagnosis not present

## 2022-01-12 DIAGNOSIS — N182 Chronic kidney disease, stage 2 (mild): Secondary | ICD-10-CM | POA: Diagnosis not present

## 2022-01-12 DIAGNOSIS — I4891 Unspecified atrial fibrillation: Secondary | ICD-10-CM | POA: Diagnosis not present

## 2022-01-12 DIAGNOSIS — E1122 Type 2 diabetes mellitus with diabetic chronic kidney disease: Secondary | ICD-10-CM | POA: Diagnosis not present

## 2022-01-12 DIAGNOSIS — Z20828 Contact with and (suspected) exposure to other viral communicable diseases: Secondary | ICD-10-CM | POA: Diagnosis not present

## 2022-01-12 DIAGNOSIS — M25461 Effusion, right knee: Secondary | ICD-10-CM | POA: Diagnosis not present

## 2022-01-12 DIAGNOSIS — S0012XD Contusion of left eyelid and periocular area, subsequent encounter: Secondary | ICD-10-CM | POA: Diagnosis not present

## 2022-01-12 DIAGNOSIS — F02811 Dementia in other diseases classified elsewhere, unspecified severity, with agitation: Secondary | ICD-10-CM | POA: Diagnosis not present

## 2022-01-12 DIAGNOSIS — F0284 Dementia in other diseases classified elsewhere, unspecified severity, with anxiety: Secondary | ICD-10-CM | POA: Diagnosis not present

## 2022-01-12 DIAGNOSIS — G309 Alzheimer's disease, unspecified: Secondary | ICD-10-CM | POA: Diagnosis not present

## 2022-01-12 DIAGNOSIS — U071 COVID-19: Secondary | ICD-10-CM | POA: Diagnosis not present

## 2022-01-12 DIAGNOSIS — I509 Heart failure, unspecified: Secondary | ICD-10-CM | POA: Diagnosis not present

## 2022-01-12 DIAGNOSIS — D692 Other nonthrombocytopenic purpura: Secondary | ICD-10-CM | POA: Diagnosis not present

## 2022-01-15 DIAGNOSIS — T148XXA Other injury of unspecified body region, initial encounter: Secondary | ICD-10-CM | POA: Diagnosis not present

## 2022-01-15 DIAGNOSIS — R296 Repeated falls: Secondary | ICD-10-CM | POA: Diagnosis not present

## 2022-01-15 DIAGNOSIS — S01111D Laceration without foreign body of right eyelid and periocular area, subsequent encounter: Secondary | ICD-10-CM | POA: Diagnosis not present

## 2022-01-16 DIAGNOSIS — I10 Essential (primary) hypertension: Secondary | ICD-10-CM | POA: Diagnosis not present

## 2022-01-19 DIAGNOSIS — J449 Chronic obstructive pulmonary disease, unspecified: Secondary | ICD-10-CM | POA: Diagnosis not present

## 2022-01-19 DIAGNOSIS — I10 Essential (primary) hypertension: Secondary | ICD-10-CM | POA: Diagnosis not present

## 2022-01-19 DIAGNOSIS — E038 Other specified hypothyroidism: Secondary | ICD-10-CM | POA: Diagnosis not present

## 2022-01-19 DIAGNOSIS — N183 Chronic kidney disease, stage 3 unspecified: Secondary | ICD-10-CM | POA: Diagnosis not present

## 2022-01-19 DIAGNOSIS — I129 Hypertensive chronic kidney disease with stage 1 through stage 4 chronic kidney disease, or unspecified chronic kidney disease: Secondary | ICD-10-CM | POA: Diagnosis not present

## 2022-01-19 DIAGNOSIS — E119 Type 2 diabetes mellitus without complications: Secondary | ICD-10-CM | POA: Diagnosis not present

## 2022-01-19 DIAGNOSIS — I4891 Unspecified atrial fibrillation: Secondary | ICD-10-CM | POA: Diagnosis not present

## 2022-01-19 DIAGNOSIS — N400 Enlarged prostate without lower urinary tract symptoms: Secondary | ICD-10-CM | POA: Diagnosis not present

## 2022-01-19 DIAGNOSIS — F0390 Unspecified dementia without behavioral disturbance: Secondary | ICD-10-CM | POA: Diagnosis not present

## 2022-01-19 DIAGNOSIS — F329 Major depressive disorder, single episode, unspecified: Secondary | ICD-10-CM | POA: Diagnosis not present

## 2022-01-19 DIAGNOSIS — E559 Vitamin D deficiency, unspecified: Secondary | ICD-10-CM | POA: Diagnosis not present

## 2022-01-19 DIAGNOSIS — D518 Other vitamin B12 deficiency anemias: Secondary | ICD-10-CM | POA: Diagnosis not present

## 2022-01-20 DIAGNOSIS — E119 Type 2 diabetes mellitus without complications: Secondary | ICD-10-CM | POA: Diagnosis not present

## 2022-01-20 DIAGNOSIS — E7849 Other hyperlipidemia: Secondary | ICD-10-CM | POA: Diagnosis not present

## 2022-01-20 DIAGNOSIS — Z79899 Other long term (current) drug therapy: Secondary | ICD-10-CM | POA: Diagnosis not present

## 2022-01-20 DIAGNOSIS — E559 Vitamin D deficiency, unspecified: Secondary | ICD-10-CM | POA: Diagnosis not present

## 2022-01-20 DIAGNOSIS — E038 Other specified hypothyroidism: Secondary | ICD-10-CM | POA: Diagnosis not present

## 2022-01-20 DIAGNOSIS — D518 Other vitamin B12 deficiency anemias: Secondary | ICD-10-CM | POA: Diagnosis not present

## 2022-01-22 DIAGNOSIS — N183 Chronic kidney disease, stage 3 unspecified: Secondary | ICD-10-CM | POA: Diagnosis not present

## 2022-01-22 DIAGNOSIS — F0390 Unspecified dementia without behavioral disturbance: Secondary | ICD-10-CM | POA: Diagnosis not present

## 2022-01-22 DIAGNOSIS — R600 Localized edema: Secondary | ICD-10-CM | POA: Diagnosis not present

## 2022-01-22 DIAGNOSIS — J449 Chronic obstructive pulmonary disease, unspecified: Secondary | ICD-10-CM | POA: Diagnosis not present

## 2022-01-22 DIAGNOSIS — N400 Enlarged prostate without lower urinary tract symptoms: Secondary | ICD-10-CM | POA: Diagnosis not present

## 2022-01-22 DIAGNOSIS — I4891 Unspecified atrial fibrillation: Secondary | ICD-10-CM | POA: Diagnosis not present

## 2022-01-29 DIAGNOSIS — F419 Anxiety disorder, unspecified: Secondary | ICD-10-CM | POA: Diagnosis not present

## 2022-01-29 DIAGNOSIS — F03918 Unspecified dementia, unspecified severity, with other behavioral disturbance: Secondary | ICD-10-CM | POA: Diagnosis not present

## 2022-01-29 DIAGNOSIS — F5102 Adjustment insomnia: Secondary | ICD-10-CM | POA: Diagnosis not present

## 2022-02-10 DIAGNOSIS — Z79899 Other long term (current) drug therapy: Secondary | ICD-10-CM | POA: Diagnosis not present

## 2022-02-10 DIAGNOSIS — S81801S Unspecified open wound, right lower leg, sequela: Secondary | ICD-10-CM | POA: Diagnosis not present

## 2022-02-13 DIAGNOSIS — F0390 Unspecified dementia without behavioral disturbance: Secondary | ICD-10-CM | POA: Diagnosis not present

## 2022-02-13 DIAGNOSIS — D518 Other vitamin B12 deficiency anemias: Secondary | ICD-10-CM | POA: Diagnosis not present

## 2022-02-13 DIAGNOSIS — I10 Essential (primary) hypertension: Secondary | ICD-10-CM | POA: Diagnosis not present

## 2022-02-13 DIAGNOSIS — E038 Other specified hypothyroidism: Secondary | ICD-10-CM | POA: Diagnosis not present

## 2022-02-13 DIAGNOSIS — F329 Major depressive disorder, single episode, unspecified: Secondary | ICD-10-CM | POA: Diagnosis not present

## 2022-02-13 DIAGNOSIS — E119 Type 2 diabetes mellitus without complications: Secondary | ICD-10-CM | POA: Diagnosis not present

## 2022-02-13 DIAGNOSIS — I4891 Unspecified atrial fibrillation: Secondary | ICD-10-CM | POA: Diagnosis not present

## 2022-02-13 DIAGNOSIS — I129 Hypertensive chronic kidney disease with stage 1 through stage 4 chronic kidney disease, or unspecified chronic kidney disease: Secondary | ICD-10-CM | POA: Diagnosis not present

## 2022-02-13 DIAGNOSIS — N400 Enlarged prostate without lower urinary tract symptoms: Secondary | ICD-10-CM | POA: Diagnosis not present

## 2022-02-13 DIAGNOSIS — N183 Chronic kidney disease, stage 3 unspecified: Secondary | ICD-10-CM | POA: Diagnosis not present

## 2022-02-13 DIAGNOSIS — E559 Vitamin D deficiency, unspecified: Secondary | ICD-10-CM | POA: Diagnosis not present

## 2022-02-13 DIAGNOSIS — J449 Chronic obstructive pulmonary disease, unspecified: Secondary | ICD-10-CM | POA: Diagnosis not present

## 2022-02-16 DIAGNOSIS — I10 Essential (primary) hypertension: Secondary | ICD-10-CM | POA: Diagnosis not present

## 2022-02-19 DIAGNOSIS — N183 Chronic kidney disease, stage 3 unspecified: Secondary | ICD-10-CM | POA: Diagnosis not present

## 2022-02-19 DIAGNOSIS — R600 Localized edema: Secondary | ICD-10-CM | POA: Diagnosis not present

## 2022-02-19 DIAGNOSIS — R296 Repeated falls: Secondary | ICD-10-CM | POA: Diagnosis not present

## 2022-02-19 DIAGNOSIS — I4891 Unspecified atrial fibrillation: Secondary | ICD-10-CM | POA: Diagnosis not present

## 2022-02-19 DIAGNOSIS — J449 Chronic obstructive pulmonary disease, unspecified: Secondary | ICD-10-CM | POA: Diagnosis not present

## 2022-02-19 DIAGNOSIS — N400 Enlarged prostate without lower urinary tract symptoms: Secondary | ICD-10-CM | POA: Diagnosis not present

## 2022-02-24 DIAGNOSIS — S81001A Unspecified open wound, right knee, initial encounter: Secondary | ICD-10-CM | POA: Diagnosis not present

## 2022-02-26 DIAGNOSIS — F03918 Unspecified dementia, unspecified severity, with other behavioral disturbance: Secondary | ICD-10-CM | POA: Diagnosis not present

## 2022-02-26 DIAGNOSIS — F419 Anxiety disorder, unspecified: Secondary | ICD-10-CM | POA: Diagnosis not present

## 2022-02-26 DIAGNOSIS — F5102 Adjustment insomnia: Secondary | ICD-10-CM | POA: Diagnosis not present

## 2022-03-13 DIAGNOSIS — F0390 Unspecified dementia without behavioral disturbance: Secondary | ICD-10-CM | POA: Diagnosis not present

## 2022-03-13 DIAGNOSIS — F329 Major depressive disorder, single episode, unspecified: Secondary | ICD-10-CM | POA: Diagnosis not present

## 2022-03-13 DIAGNOSIS — E119 Type 2 diabetes mellitus without complications: Secondary | ICD-10-CM | POA: Diagnosis not present

## 2022-03-13 DIAGNOSIS — D518 Other vitamin B12 deficiency anemias: Secondary | ICD-10-CM | POA: Diagnosis not present

## 2022-03-13 DIAGNOSIS — E038 Other specified hypothyroidism: Secondary | ICD-10-CM | POA: Diagnosis not present

## 2022-03-13 DIAGNOSIS — I10 Essential (primary) hypertension: Secondary | ICD-10-CM | POA: Diagnosis not present

## 2022-03-13 DIAGNOSIS — N400 Enlarged prostate without lower urinary tract symptoms: Secondary | ICD-10-CM | POA: Diagnosis not present

## 2022-03-13 DIAGNOSIS — I129 Hypertensive chronic kidney disease with stage 1 through stage 4 chronic kidney disease, or unspecified chronic kidney disease: Secondary | ICD-10-CM | POA: Diagnosis not present

## 2022-03-13 DIAGNOSIS — I4891 Unspecified atrial fibrillation: Secondary | ICD-10-CM | POA: Diagnosis not present

## 2022-03-13 DIAGNOSIS — J449 Chronic obstructive pulmonary disease, unspecified: Secondary | ICD-10-CM | POA: Diagnosis not present

## 2022-03-13 DIAGNOSIS — E559 Vitamin D deficiency, unspecified: Secondary | ICD-10-CM | POA: Diagnosis not present

## 2022-03-13 DIAGNOSIS — N183 Chronic kidney disease, stage 3 unspecified: Secondary | ICD-10-CM | POA: Diagnosis not present

## 2022-03-19 DIAGNOSIS — F329 Major depressive disorder, single episode, unspecified: Secondary | ICD-10-CM | POA: Diagnosis not present

## 2022-03-19 DIAGNOSIS — J449 Chronic obstructive pulmonary disease, unspecified: Secondary | ICD-10-CM | POA: Diagnosis not present

## 2022-03-19 DIAGNOSIS — N183 Chronic kidney disease, stage 3 unspecified: Secondary | ICD-10-CM | POA: Diagnosis not present

## 2022-03-19 DIAGNOSIS — R451 Restlessness and agitation: Secondary | ICD-10-CM | POA: Diagnosis not present

## 2022-03-19 DIAGNOSIS — N400 Enlarged prostate without lower urinary tract symptoms: Secondary | ICD-10-CM | POA: Diagnosis not present

## 2022-03-19 DIAGNOSIS — I4891 Unspecified atrial fibrillation: Secondary | ICD-10-CM | POA: Diagnosis not present

## 2022-03-19 DIAGNOSIS — S81801S Unspecified open wound, right lower leg, sequela: Secondary | ICD-10-CM | POA: Diagnosis not present

## 2022-03-19 DIAGNOSIS — I129 Hypertensive chronic kidney disease with stage 1 through stage 4 chronic kidney disease, or unspecified chronic kidney disease: Secondary | ICD-10-CM | POA: Diagnosis not present

## 2022-03-19 DIAGNOSIS — E559 Vitamin D deficiency, unspecified: Secondary | ICD-10-CM | POA: Diagnosis not present

## 2022-03-19 DIAGNOSIS — R600 Localized edema: Secondary | ICD-10-CM | POA: Diagnosis not present

## 2022-03-20 DIAGNOSIS — R0689 Other abnormalities of breathing: Secondary | ICD-10-CM | POA: Diagnosis not present

## 2022-03-20 DIAGNOSIS — R000 Tachycardia, unspecified: Secondary | ICD-10-CM | POA: Diagnosis not present

## 2022-03-20 DIAGNOSIS — R4020 Unspecified coma: Secondary | ICD-10-CM | POA: Diagnosis not present

## 2022-03-20 DIAGNOSIS — R0902 Hypoxemia: Secondary | ICD-10-CM | POA: Diagnosis not present

## 2022-03-20 DIAGNOSIS — R404 Transient alteration of awareness: Secondary | ICD-10-CM | POA: Diagnosis not present

## 2022-04-20 DEATH — deceased
